# Patient Record
Sex: Female | Born: 1985 | Race: White | Hispanic: No | Marital: Single | Smoking: Current every day smoker
Health system: Southern US, Community
[De-identification: ages and names within clinical notes are randomized; demographics above are authoritative.]

## PROBLEM LIST (undated history)

## (undated) DIAGNOSIS — F1420 Cocaine dependence, uncomplicated: Secondary | ICD-10-CM

## (undated) DIAGNOSIS — M5430 Sciatica, unspecified side: Secondary | ICD-10-CM

## (undated) DIAGNOSIS — F329 Major depressive disorder, single episode, unspecified: Secondary | ICD-10-CM

## (undated) DIAGNOSIS — F319 Bipolar disorder, unspecified: Secondary | ICD-10-CM

## (undated) DIAGNOSIS — F32A Depression, unspecified: Secondary | ICD-10-CM

## (undated) DIAGNOSIS — F609 Personality disorder, unspecified: Secondary | ICD-10-CM

## (undated) DIAGNOSIS — F1120 Opioid dependence, uncomplicated: Secondary | ICD-10-CM

## (undated) DIAGNOSIS — F419 Anxiety disorder, unspecified: Secondary | ICD-10-CM

## (undated) DIAGNOSIS — I38 Endocarditis, valve unspecified: Secondary | ICD-10-CM

## (undated) DIAGNOSIS — R45851 Suicidal ideations: Secondary | ICD-10-CM

## (undated) DIAGNOSIS — M869 Osteomyelitis, unspecified: Secondary | ICD-10-CM

---

## 2014-09-19 ENCOUNTER — Encounter (HOSPITAL_COMMUNITY): Payer: Self-pay

## 2014-09-19 ENCOUNTER — Emergency Department (HOSPITAL_COMMUNITY)
Admission: EM | Admit: 2014-09-19 | Discharge: 2014-09-19 | Disposition: A | Discharge status: Home / Self Care | Payer: Self-pay | Attending: Emergency Medicine | Admitting: Emergency Medicine

## 2014-09-19 DIAGNOSIS — K0889 Other specified disorders of teeth and supporting structures: Secondary | ICD-10-CM

## 2014-09-19 DIAGNOSIS — K088 Other specified disorders of teeth and supporting structures: Secondary | ICD-10-CM | POA: Insufficient documentation

## 2014-09-19 DIAGNOSIS — K029 Dental caries, unspecified: Secondary | ICD-10-CM | POA: Insufficient documentation

## 2014-09-19 DIAGNOSIS — R112 Nausea with vomiting, unspecified: Secondary | ICD-10-CM | POA: Insufficient documentation

## 2014-09-19 DIAGNOSIS — R509 Fever, unspecified: Secondary | ICD-10-CM | POA: Insufficient documentation

## 2014-09-19 HISTORY — DX: Osteomyelitis, unspecified: M86.9

## 2014-09-19 HISTORY — DX: Endocarditis, valve unspecified: I38

## 2014-09-19 HISTORY — DX: Sciatica, unspecified side: M54.30

## 2014-09-19 MED ORDER — ONDANSETRON HCL 4 MG PO TABS: 4.0000 mg | ORAL_TABLET | Freq: Three times a day (TID) | ORAL | Status: DC | PRN

## 2014-09-19 MED ORDER — HYDROCODONE-ACETAMINOPHEN 5-325 MG PO TABS
2.0000 | ORAL_TABLET | Freq: Once | ORAL | Status: AC
  Administered 2014-09-19: 2 via ORAL
  Filled 2014-09-19: qty 2

## 2014-09-19 MED ORDER — CLINDAMYCIN HCL 300 MG PO CAPS
450.0000 mg | ORAL_CAPSULE | Freq: Once | ORAL | Status: AC
  Administered 2014-09-19: 450 mg via ORAL
  Filled 2014-09-19: qty 1

## 2014-09-19 MED ORDER — CLINDAMYCIN HCL 150 MG PO CAPS: 450.0000 mg | ORAL_CAPSULE | Freq: Three times a day (TID) | ORAL | Status: DC

## 2014-09-19 NOTE — ED Notes (Signed)
Social work at bedside, resources provided.  Pt ambulatory with steady gait to exit.

## 2014-09-19 NOTE — ED Provider Notes (Signed)
CSN: [id number]     Arrival date & time 09/19/14  1323 History  This chart was scribed for non-physician practitioner, Danelle Berry, PA-C, working with Blane Ohara, MD by Freida Busman, ED Scribe. This patient was seen in room WTR7/WTR7 and the patient's care was started at 1:37 PM.   Chief Complaint  Patient presents with  . dental abscess    The history is provided by the patient. No language interpreter was used.    HPI Comments:  Brenda Stafford is a 29 y.o. female who presents to the Emergency Department complaining of 5/10 right lower dental pain that stared about 1 week ago. She reports associated swelling to the site that started 2 days ago which has improved today, subjective fever, nausea, and vomiting; no vomiting today. She denies injury to her mouth or recent breaking of the tooth. She has taken ibuproen with minimal relief of her pain.  She denies abdominal pain, headache, difficulty swallowing, difficulty breathing, CP, SOB.  No past medical history on file. No past surgical history on file. No family history on file. Social History  Substance Use Topics  . Smoking status: Not on file  . Smokeless tobacco: Not on file  . Alcohol Use: Not on file   OB History    No data available     Review of Systems  Constitutional: Positive for fever (Subjective).  HENT: Positive for dental problem.   Gastrointestinal: Positive for nausea and vomiting.   Allergies  Review of patient's allergies indicates not on file.  Home Medications   Prior to Admission medications   Not on File   There were no vitals taken for this visit. Physical Exam  Constitutional: She is oriented to person, place, and time. She appears well-developed and well-nourished. No distress.  HENT:  Head: Normocephalic and atraumatic.  Right Ear: External ear normal.  Left Ear: External ear normal.  Nose: Nose normal.  Mouth/Throat: Uvula is midline, oropharynx is clear and moist and mucous membranes are  normal. Mucous membranes are not pale, not dry and not cyanotic. No oral lesions. No trismus in the jaw. Abnormal dentition. Dental caries present. No dental abscesses, uvula swelling or lacerations. No oropharyngeal exudate, posterior oropharyngeal edema, posterior oropharyngeal erythema or tonsillar abscesses.    Right lower jaw, last molar extensive decay TTP to surrounding gums No obvious abscess  Multiple dental caries and missing teeth noted  Submandibular lympadenopthy  No sublingual tenderness    Eyes: Conjunctivae and EOM are normal. Pupils are equal, round, and reactive to light. Right eye exhibits no discharge. Left eye exhibits no discharge. No scleral icterus.  Neck: Normal range of motion. Neck supple. No JVD present. No tracheal deviation present.  Cardiovascular: Normal rate and regular rhythm.   Pulmonary/Chest: Effort normal and breath sounds normal. No stridor. No respiratory distress.  Musculoskeletal: Normal range of motion. She exhibits no edema.  Neurological: She is alert and oriented to person, place, and time. She exhibits normal muscle tone. Coordination normal.  Skin: Skin is warm and dry. No rash noted. She is not diaphoretic. No erythema. No pallor.  Psychiatric: She has a normal mood and affect. Her behavior is normal. Judgment and thought content normal.  Nursing note and vitals reviewed.   ED Course  Procedures  DIAGNOSTIC STUDIES:  Oxygen Saturation is 98% on Ra, normal by my interpretation.    COORDINATION OF CARE:  2:00 PM Discussed treatment plan with pt at bedside and pt agreed to plan.  Labs Review Labs  Reviewed - No data to display  Imaging Review No results found. I have personally reviewed and evaluated these images and lab results as part of my medical decision-making.   EKG Interpretation None      MDM   Final diagnoses:  None   Patient with toothache.  No gross abscess.  Exam unconcerning for Ludwig's angina or spread of  infection.  Will treat with clinda and pain medicine.  Urged patient to follow-up with dentist.  SW consulted to assist pt, new to area, claims she has no where to stay and nothing to eat.  Food and drink given while in the ED.   I personally performed the services described in this documentation, which was scribed in my presence. The recorded information has been reviewed and is accurate.    Danelle Berry, PA-C 09/25/14 1610  Blane Ohara, MD 09/28/14 [phone]

## 2014-09-19 NOTE — ED Notes (Signed)
Per EMS comes in for dental abscess on back right side that has been bothering her x 1 week.  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

## 2014-09-19 NOTE — ED Notes (Signed)
Pt given sandwich and and beverage per request.  Social worker contacted re: providing resources for pt.  Pt reports she "has no place to go and no food to eat".

## 2014-09-19 NOTE — Discharge Instructions (Signed)
Dental Pain °A tooth ache may be caused by cavities (tooth decay). Cavities expose the nerve of the tooth to air and hot or cold temperatures. It may come from an infection or abscess (also called a boil or furuncle) around your tooth. It is also often caused by dental caries (tooth decay). This causes the pain you are having. °DIAGNOSIS  °Your caregiver can diagnose this problem by exam. °TREATMENT  °· If caused by an infection, it may be treated with medications which kill germs (antibiotics) and pain medications as prescribed by your caregiver. Take medications as directed. °· Only take over-the-counter or prescription medicines for pain, discomfort, or fever as directed by your caregiver. °· Whether the tooth ache today is caused by infection or dental disease, you should see your dentist as soon as possible for further care. °SEEK MEDICAL CARE IF: °The exam and treatment you received today has been provided on an emergency basis only. This is not a substitute for complete medical or dental care. If your problem worsens or new problems (symptoms) appear, and you are unable to meet with your dentist, call or return to this location. °SEEK IMMEDIATE MEDICAL CARE IF:  °· You have a fever. °· You develop redness and swelling of your face, jaw, or neck. °· You are unable to open your mouth. °· You have severe pain uncontrolled by pain medicine. °MAKE SURE YOU:  °· Understand these instructions. °· Will watch your condition. °· Will get help right away if you are not doing well or get worse. °Document Released: 01/06/2005 Document Revised: 03/31/2011 Document Reviewed: 08/25/2007 °ExitCare® Patient Information ©2015 [employer]. This information is not intended to replace advice given to you by your health care provider. Make sure you discuss any questions you have with your health care provider. ° °Emergency Department Resource Guide °1) Find a Doctor and Pay Out of Pocket °Although you won't have to find out who  is covered by your insurance plan, it is a good idea to ask around and get recommendations. You will then need to call the office and see if the doctor you have chosen will accept you as a new patient and what types of options they offer for patients who are self-pay. Some doctors offer discounts or will set up payment plans for their patients who do not have insurance, but you will need to ask so you aren't surprised when you get to your appointment. ° °2) Contact Your Local Health Department °Not all health departments have doctors that can see patients for sick visits, but many do, so it is worth a call to see if yours does. If you don't know where your local health department is, you can check in your phone book. The CDC also has a tool to help you locate your state's health department, and many state websites also have listings of all of their local health departments. ° °3) Find a Walk-in Clinic °If your illness is not likely to be very severe or complicated, you may want to try a walk in clinic. These are popping up all over the country in pharmacies, drugstores, and shopping centers. They're usually staffed by nurse practitioners or physician assistants that have been trained to treat common illnesses and complaints. They're usually fairly quick and inexpensive. However, if you have serious medical issues or chronic medical problems, these are probably not your best option. ° °No Primary Care Doctor: °- Call [facility] at  832-8000 - they can help you locate a primary   care doctor that  accepts your insurance, provides certain services, etc. °- Physician Referral Service- 1-800-533-3463 ° °Chronic Pain Problems: °Organization         Address  Phone   Notes  °[facility]  (336) 297-2271 Patients need to be referred by their primary care doctor.  ° °Medication Assistance: °Organization         Address  Phone   Notes  °[place] [facility] [address]  [address] °[place], [place] [address] (336) 641-8030 --Must be a resident of [place] °-- Must have NO insurance coverage whatsoever (no Medicaid/ Medicare, etc.) °-- The pt. MUST have a primary care doctor that directs their care regularly and follows them in the community °  °[facility]  (866) 331-1348   °[employer]  (888) 892-1162   ° °Agencies that provide inexpensive medical care: °Organization         Address  Phone   Notes  °[facility]  (336) 832-8035   °[facility]    (336) 832-7272   °[facility] [address] °[place], [place] [address] (336) 832-4777   °[facility] [address], °[place] (336) 271-4999   °[facility]    (336) 373-0678   °[facility]    (336) 272-1050   °[facility] ° [address], [place] Phone:  (336) 832-4444, Fax:  (336) 832-4440 Hours of Operation:  9 am - 6 pm, M-F.  Also accepts Medicaid/Medicare and self-pay.  °[facility] ° [address], [place] Phone: (336) 832-3150, Fax: (336) 832-3151. Hours of Operation:  8:30 am - 5:30 pm, M-F.  Also accepts Medicaid and self-pay.  °[facility] [place] [address], [place] Phone: (336) 878-6027   °[facility] [address], [place], [place] (336)723-1848, Ext. 123 Mondays & Thursdays: 7-9 AM.  First 15 patients are seen on a first come, first serve basis. °  ° °Medicaid-accepting [place] Providers: ° °Organization         Address  Phone   Notes  °[facility] [address], [place] (336) 641-2100 Also accepts self-pay patients.  °[facility] [address], [place] ° (336) 856-9996   °[facility] [address], [place] (336) 288-8857   °[facility] [address] [place] [address], [place] (336) 299-7000   °Veita Bland [address], [place]  ° (336) 373-1557 Only accepts [place] Access Medicaid patients after they have their name applied to their card.  ° °Self-Pay (no insurance) in [place]: ° °Organization         Address  Phone   Notes  °Sickle Cell Patients, [facility] [address], [place] (336) 832-1970   °[facility] [address], [place] (336) 832-4400   ° [facility] ° [address], [place] (336) 992-4800   °[facility]/Dr. Osei-Bonsu ° [address] [place] [address], [place] or [address], [place] (336) 841-8500 Phone number for both [place] and [place] locations is the same.  °[facility] [address], [place] (336) 299-0000   °[facility] [address] [place] [address], [place] or [address] (336) 852-7530 °(336) 878-2260   °  [facility] [address], [place] (336) 350-1642, phone; (336) 294-5005, fax Sees patients 1st and 3rd Saturday of every month.  Must not qualify for public or private insurance (i.e. Medicaid, Medicare, [place] Health Choice, Veterans' Benefits) • Household income should be no more than 200% of the poverty level •The clinic cannot treat you if you are pregnant or think you are pregnant • Sexually transmitted diseases are not treated at the clinic.  ° ° °Dental Care: °Organization         Address  Phone  Notes  °[facility] [address], [place] (336) 641-6152 Accepts children up to age 21 who are enrolled in Medicaid or [place] Health Choice; pregnant women with a Medicaid card; and children who have applied for Medicaid or [place] Health Choice, but were declined, whose parents can pay a reduced fee at time of service.  °[facility] [place]  [address], [place] (336) 641-7733 Accepts children up to age 21 who are  enrolled in Medicaid or [place] Health Choice; pregnant women with a Medicaid card; and children who have applied for Medicaid or [place] Health Choice, but were declined, whose parents can pay a reduced fee at time of service.  °[facility] ° [address], [place] (336) 641-4533 Patients are seen by appointment only. Walk-ins are not accepted. [facility] will see patients 18 years of age and older. °Monday - Tuesday (8am-5pm) °Most Wednesdays (8:30-5pm) °$30 per visit, cash only  °[facility] ° [address], [place] (336) 641-4533 Patients are seen by appointment only. Walk-ins are not accepted. [facility] will see patients 18 years of age and older. °One Wednesday Evening (Monthly: Volunteer Based).  $30 per visit, cash only  °[facility]  (919) 537-3737 for adults; Children under age 4, call Graduate Pediatric Dentistry at (919) [phone]. Children aged 4-14, please call (919) 537-3737 to request a pediatric application. ° Dental services are provided in all areas of dental care including fillings, crowns and bridges, complete and partial dentures, implants, gum treatment, root canals, and extractions. Preventive care is also provided. Treatment is provided to both adults and children. °Patients are selected via a lottery and there is often a waiting list. °  °[facility] [address], °[place] ° (336) 763-8833 [email or web address] °  °[facility] [address], [place], [place] (336)723-1848, Ext. 123 Second and Fourth Thursday of each month, opens at 6:30 AM; Clinic ends at 9 AM.  Patients are seen on a first-come first-served basis, and a limited number are seen during each clinic.  ° °[facility] ° [address], [place], [place] (336) 723-7904   Eligibility Requirements °You must have lived in [place], [place], or [place] counties for at least the last three months. °  You  cannot be eligible for state or federal sponsored healthcare insurance, including Veterans Administration, Medicaid, or Medicare. °  You generally cannot be eligible for healthcare insurance through your employer.  °  How to apply: °Eligibility screenings are held every Tuesday and Wednesday afternoon from 1:00 pm until 4:00 pm. You do not need an appointment for the interview!  °[facility] [address], [place], [place] 336-631-2330   °[facility]  336-342-8273   °[facility]  336-703-3100   °[facility]  336-570-6415   ° °Behavioral Health Resources in the Community: °Intensive Outpatient Programs °Organization         Address  Phone  Notes  °[facility] [address], [place], [place] 336-878-6098   °[facility] [address], [place], [place] 336-832-9800   °[facility] [address], [place], [place] ° 336-882-2125   °[facility] [address],  °[place], [place] 1-800-853-5163 or 336-641-4981   °Substance Abuse Resources °Organization         Address  Phone  Notes  °[facility]  336-882-2125   °[facility]  336-784-9470   °[facility]  336-285-9073   °[facility]  336-845-3988   °Residential & Outpatient Substance Abuse Program  1-800-659-3381   °Psychological Services °Organization         Address  Phone  Notes  ° Health  336- 832-9600   °[facility]  336- 378-7881   °[facility] [address], [place] 1-800-853-5163 or 336-641-4981   ° °Mobile Crisis Teams °Organization         Address  Phone  Notes  °[facility], Mobile Crisis Care Unit  1-877-626-1772   °Assertive °[facility] ° [address] [place], [place] 336-834-9664   °Sharon DeEsch [address] °[place] 336-554-5454   ° °Self-Help/Support  Groups °Organization         Address  Phone             Notes  °[facility] - variety of support groups  336- 373-1402 Call for more information  °Narcotics Anonymous (NA), [facility] [address], °[place]  2 meetings at this location  ° °Residential Treatment Programs °Organization         Address  Phone  Notes  °[facility] [address],    °[place]  1-866-801-8205   °[facility] ° [address], [place], [place] 704-293-8524   °[facility] [address], [place] 336-845-3988 Admissions: 8am-3pm M-F  °[facility] [address]    °[place], [place] 336-841-1104   °[facility] [address], [place], [place] 336-379-7146   °[facility] [address]  °[place], [place] 336-285-9073   °[facility] - Intensive Outpatient [address], , [place] 336-852-3033   °[facility] [address]  °[place], [place] 1-877-615-2722 or 336-784-9470   °[facility] [address] , [place] 336-227-7417 Accepts Medicaid  °[facility] [address]  ° [place] 1-800-659-3381 Substance Abuse/Addiction Treatment  ° °[place] Behavioral Health Resources °Organization         Address  Phone  Notes  °[facility]  (888) 581-9988   °Julie Brannon, PhD [address] [place], [place]   (336) 349-5553 or (336) 951-0000   °[facility]   [address] °[place],  (336) 349-4454   °[facility] [address], [place],  (336) 342-8316 Insurance/Medicaid/sponsorship through [facility]  °[facility] [address]                                    ,  (336) 342-8316 Therapy/tele-psych/case  °[facility]   [address]  ° [place], [place] (336) 349-2233    °Dr. Arfeen  (336) 349-4544   °[facility] [place]  [employer] [place] [facility] 1) [address],  °2) [address], [place] °3)  [address], [place] (336) 349-3220 °(336) 342-7768 ° °(336) 342-8140   °[place] [facility] (336) 342-1394 or (336) 342-3537 (After Hours)    ° ° ° °

## 2014-09-19 NOTE — ED Notes (Signed)
Bed: WTR7 Expected date:  Expected time:  Means of arrival:  Comments: EMS- 28yo F, dental pain

## 2014-09-23 ENCOUNTER — Encounter (HOSPITAL_COMMUNITY): Payer: Self-pay | Admitting: Emergency Medicine

## 2014-09-23 ENCOUNTER — Emergency Department (HOSPITAL_COMMUNITY)
Admission: EM | Admit: 2014-09-23 | Discharge: 2014-09-24 | Disposition: A | Discharge status: Home / Self Care | Payer: Medicaid - Out of State | Attending: Emergency Medicine | Admitting: Emergency Medicine

## 2014-09-23 DIAGNOSIS — F1410 Cocaine abuse, uncomplicated: Secondary | ICD-10-CM | POA: Insufficient documentation

## 2014-09-23 DIAGNOSIS — Z792 Long term (current) use of antibiotics: Secondary | ICD-10-CM | POA: Insufficient documentation

## 2014-09-23 DIAGNOSIS — F1994 Other psychoactive substance use, unspecified with psychoactive substance-induced mood disorder: Secondary | ICD-10-CM | POA: Diagnosis present

## 2014-09-23 DIAGNOSIS — Z720 Tobacco use: Secondary | ICD-10-CM | POA: Insufficient documentation

## 2014-09-23 DIAGNOSIS — R4689 Other symptoms and signs involving appearance and behavior: Secondary | ICD-10-CM

## 2014-09-23 DIAGNOSIS — F1110 Opioid abuse, uncomplicated: Secondary | ICD-10-CM

## 2014-09-23 DIAGNOSIS — Z880 Allergy status to penicillin: Secondary | ICD-10-CM | POA: Insufficient documentation

## 2014-09-23 DIAGNOSIS — Z8739 Personal history of other diseases of the musculoskeletal system and connective tissue: Secondary | ICD-10-CM | POA: Insufficient documentation

## 2014-09-23 DIAGNOSIS — Z8679 Personal history of other diseases of the circulatory system: Secondary | ICD-10-CM | POA: Insufficient documentation

## 2014-09-23 DIAGNOSIS — R4589 Other symptoms and signs involving emotional state: Secondary | ICD-10-CM

## 2014-09-23 LAB — COMPREHENSIVE METABOLIC PANEL
ALBUMIN: 4.4 g/dL (ref 3.5–5.0)
ALK PHOS: 61 U/L (ref 38–126)
ALT: 37 U/L (ref 14–54)
AST: 38 U/L (ref 15–41)
Anion gap: 7 (ref 5–15)
BILIRUBIN TOTAL: 0.3 mg/dL (ref 0.3–1.2)
BUN: 13 mg/dL (ref 6–20)
CO2: 23 mmol/L (ref 22–32)
Calcium: 9.7 mg/dL (ref 8.9–10.3)
Chloride: 109 mmol/L (ref 101–111)
Creatinine, Ser: 0.92 mg/dL (ref 0.44–1.00)
GFR calc Af Amer: >60 mL/min (ref >60)
GFR calc non Af Amer: >60 mL/min (ref >60)
GLUCOSE: 140 mg/dL — AB (ref 65–99)
POTASSIUM: 3.6 mmol/L (ref 3.5–5.1)
Sodium: 139 mmol/L (ref 135–145)
TOTAL PROTEIN: 8 g/dL (ref 6.5–8.1)

## 2014-09-23 LAB — CBC
HEMATOCRIT: 44 % (ref 36.0–46.0)
HEMOGLOBIN: 15.3 g/dL — AB (ref 12.0–15.0)
MCH: 32.3 pg (ref 26.0–34.0)
MCHC: 34.8 g/dL (ref 30.0–36.0)
MCV: 93 fL (ref 78.0–100.0)
Platelets: 295 10*3/uL (ref 150–400)
RBC: 4.73 MIL/uL (ref 3.87–5.11)
RDW: 14.4 % (ref 11.5–15.5)
WBC: 8 10*3/uL (ref 4.0–10.5)

## 2014-09-23 LAB — RAPID URINE DRUG SCREEN, HOSP PERFORMED
AMPHETAMINES: NOT DETECTED
BARBITURATES: NOT DETECTED
BENZODIAZEPINES: NOT DETECTED
Cocaine: POSITIVE — AB
Opiates: POSITIVE — AB
TETRAHYDROCANNABINOL: NOT DETECTED

## 2014-09-23 LAB — SALICYLATE LEVEL: Salicylate Lvl: 4.6 mg/dL (ref 2.8–30.0)

## 2014-09-23 LAB — I-STAT BETA HCG BLOOD, ED (MC, WL, AP ONLY)

## 2014-09-23 LAB — ACETAMINOPHEN LEVEL: Acetaminophen (Tylenol), Serum: <10 ug/mL — ABNORMAL LOW (ref 10–30)

## 2014-09-23 LAB — ETHANOL: Alcohol, Ethyl (B): <5 mg/dL (ref <5)

## 2014-09-23 MED ORDER — ALUM & MAG HYDROXIDE-SIMETH 200-200-20 MG/5ML PO SUSP: 30.0000 mL | ORAL | Status: DC | PRN

## 2014-09-23 MED ORDER — LORAZEPAM 1 MG PO TABS
1.0000 mg | ORAL_TABLET | Freq: Three times a day (TID) | ORAL | Status: DC | PRN
  Administered 2014-09-23: 1 mg via ORAL
  Filled 2014-09-23: qty 1

## 2014-09-23 MED ORDER — ACETAMINOPHEN 325 MG PO TABS: 650.0000 mg | ORAL_TABLET | ORAL | Status: DC | PRN

## 2014-09-23 MED ORDER — IBUPROFEN 200 MG PO TABS: 600.0000 mg | ORAL_TABLET | Freq: Three times a day (TID) | ORAL | Status: DC | PRN

## 2014-09-23 MED ORDER — CLONIDINE HCL 0.1 MG PO TABS
0.1000 mg | ORAL_TABLET | Freq: Three times a day (TID) | ORAL | Status: DC | PRN
  Administered 2014-09-23: 0.1 mg via ORAL
  Filled 2014-09-23: qty 1

## 2014-09-23 MED ORDER — ZOLPIDEM TARTRATE 5 MG PO TABS
5.0000 mg | ORAL_TABLET | Freq: Every evening | ORAL | Status: DC | PRN
Start: 2014-09-23 — End: 2014-09-24
  Administered 2014-09-23: 5 mg via ORAL
  Filled 2014-09-23: qty 1

## 2014-09-23 MED ORDER — ONDANSETRON HCL 4 MG PO TABS
4.0000 mg | ORAL_TABLET | Freq: Three times a day (TID) | ORAL | Status: DC | PRN
  Administered 2014-09-23: 4 mg via ORAL
  Filled 2014-09-23: qty 1

## 2014-09-23 MED ORDER — NICOTINE 21 MG/24HR TD PT24
21.0000 mg | MEDICATED_PATCH | Freq: Every day | TRANSDERMAL | Status: DC
  Administered 2014-09-23: 21 mg via TRANSDERMAL
  Filled 2014-09-23: qty 1

## 2014-09-23 MED ORDER — LOPERAMIDE HCL 2 MG PO CAPS
2.0000 mg | ORAL_CAPSULE | ORAL | Status: DC | PRN
  Administered 2014-09-23: 2 mg via ORAL
  Filled 2014-09-23: qty 1

## 2014-09-23 NOTE — ED Notes (Signed)
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

## 2014-09-23 NOTE — ED Notes (Signed)
Sandwich,sprite, and cheese provided per request, Sitter at bedside.

## 2014-09-23 NOTE — ED Notes (Addendum)
Noticed call bell had been pulled from the wall at the nurses station. As I was walking in to check on the patient I noticed she was attempting to close the blinds to the room. Upon opening the door, pt had call bell cord wrapped twice around her neck with red indention's into skin around neck where cord had been pulled more tightly. I walked over to patient who was scrambling to try to put the cord back into the wall and apologizing for having pulled it out of the wall. I told her "I need you to take the cord off from around your neck." She said "No, I don't want to do that." I began to attempt to remove the cord from around her neck and called security in to help. The patient allowed me to take the cord off as she began to vigorously eat her sandwich that was on the table. As she was eating I also noticed superficial lacerations that appeared to be new length-wise down her wrists bilaterally. The charge nurse was notified and all belongings/sharp objects/and cords were removed from the patient's room.

## 2014-09-23 NOTE — ED Notes (Signed)
Pt becoming agitated with staff after she was asked to stay in her room. Security called to bedside.
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
## 2014-09-23 NOTE — ED Notes (Signed)
TTS will consult at [facility]- in the hospital now

## 2014-09-23 NOTE — ED Notes (Addendum)
Pt. To [facility] from ED ambulatory without difficulty, to room 34 . Report from Autumn RN. Pt. Is alert and oriented, warm and dry in no distress. Pt. Denies SI, HI, and AVH. Pt. Calm and cooperative. Pt. Made aware of security cameras and Q15 minute rounds. Pt. Encouraged to let Nursing staff know of any concerns or needs.

## 2014-09-23 NOTE — ED Provider Notes (Signed)
CSN: [id number]     Arrival date & time 09/23/14  1750 History   First MD Initiated Contact with Patient 09/23/14 1759     Chief Complaint  Patient presents with  . heroin detox      (Consider location/radiation/quality/duration/timing/severity/associated sxs/prior Treatment) Patient is a 29 y.o. female presenting with drug problem. The history is provided by the patient.  Drug Problem This is a new problem. The current episode started more than 1 week ago. The problem occurs constantly. The problem has not changed since onset.Pertinent negatives include no abdominal pain and no shortness of breath. Nothing aggravates the symptoms. Nothing relieves the symptoms. She has tried nothing for the symptoms.    Past Medical History  Diagnosis Date  . Endocarditis   . Osteomyelitis   . Sciatica    No past surgical history on file. No family history on file. Social History  Substance Use Topics  . Smoking status: Current Every Day Smoker  . Smokeless tobacco: None  . Alcohol Use: No   OB History    No data available     Review of Systems  Constitutional: Negative for fever.  Respiratory: Negative for cough and shortness of breath.   Gastrointestinal: Negative for vomiting and abdominal pain.  Psychiatric/Behavioral: Positive for suicidal ideas.  All other systems reviewed and are negative.     Allergies  Amoxicillin; Bactrim; Keflex; Penicillins; and Tramadol  Home Medications   Prior to Admission medications   Medication Sig Start Date End Date Taking? Authorizing Provider  clindamycin (CLEOCIN) 150 MG capsule Take 3 capsules (450 mg total) by mouth 3 (three) times daily. May dispense as 150mg  capsules 09/19/14   Danelle Berry, PA-C  ondansetron (ZOFRAN) 4 MG tablet Take 1 tablet (4 mg total) by mouth every 8 (eight) hours as needed for nausea or vomiting. 09/19/14   Danelle Berry, PA-C   BP 118/75 mmHg  Pulse 105  Temp(Src) 98.5 F (36.9 C) (Oral)  Resp 16  SpO2 99%  LMP  09/21/2014 Physical Exam  Constitutional: She is oriented to person, place, and time. She appears well-developed and well-nourished. No distress.  HENT:  Head: Normocephalic and atraumatic.  Mouth/Throat: Oropharynx is clear and moist.  Eyes: EOM are normal. Pupils are equal, round, and reactive to light.  Neck: Normal range of motion. Neck supple.  Cardiovascular: Normal rate and regular rhythm.  Exam reveals no friction rub.   No murmur heard. Pulmonary/Chest: Effort normal and breath sounds normal. No respiratory distress. She has no wheezes. She has no rales.  Abdominal: Soft. She exhibits no distension. There is no tenderness. There is no rebound.  Musculoskeletal: Normal range of motion. She exhibits no edema.  Neurological: She is alert and oriented to person, place, and time.  Skin: She is not diaphoretic.  Psychiatric: She expresses suicidal ideation. She expresses no homicidal ideation. She expresses no suicidal plans and no homicidal plans.  Nursing note and vitals reviewed.   ED Course  Procedures (including critical care time) Labs Review Labs Reviewed  COMPREHENSIVE METABOLIC PANEL - Abnormal; Notable for the following:    Glucose, Bld 140 (*)    All other components within normal limits  ACETAMINOPHEN LEVEL - Abnormal; Notable for the following:    Acetaminophen (Tylenol), Serum <10 (*)    All other components within normal limits  CBC - Abnormal; Notable for the following:    Hemoglobin 15.3 (*)    All other components within normal limits  ETHANOL  SALICYLATE LEVEL  URINE  RAPID DRUG SCREEN, HOSP PERFORMED  I-STAT BETA HCG BLOOD, ED ([facility], [facility], [facility] ONLY)    Imaging Review No results found. I have personally reviewed and evaluated these images and lab results as part of my medical decision-making.   EKG Interpretation None      MDM   Final diagnoses:  Suicidal behavior  Heroin abuse    14F here with heroin abuse. Vaguely suicidal without plan. Hx of  suicide attempt previously. Has been using heroin for years, has only done formal detox once. AFVSS here. Tearful, just broke up with her boyfriend. Will consult TTS. Patient tried to strangle herself with a cord off the wall. IVC placed by me.  Elwin Mocha, MD 09/23/14 2255

## 2014-09-23 NOTE — [facility] Counselor (Signed)
Consulted with Nanine Means, DNP who recommends patient be observed overnight and evaluated in the morning by psychiatry.  Informed EDP Dr. Gwendolyn Grant of disposition and he states that he will order labs and send patient back to [facility].   Davina Poke, LCSW Therapeutic Triage Specialist [facility] 09/23/2014 7:44 PM

## 2014-09-24 ENCOUNTER — Encounter (HOSPITAL_COMMUNITY): Payer: Self-pay

## 2014-09-24 ENCOUNTER — Emergency Department (HOSPITAL_COMMUNITY)
Admission: EM | Admit: 2014-09-24 | Discharge: 2014-09-25 | Disposition: A | Discharge status: Other Acute Inpatient Hospital | Payer: Medicaid - Out of State | Attending: Emergency Medicine | Admitting: Emergency Medicine

## 2014-09-24 DIAGNOSIS — F329 Major depressive disorder, single episode, unspecified: Secondary | ICD-10-CM | POA: Insufficient documentation

## 2014-09-24 DIAGNOSIS — Z79899 Other long term (current) drug therapy: Secondary | ICD-10-CM | POA: Insufficient documentation

## 2014-09-24 DIAGNOSIS — F1994 Other psychoactive substance use, unspecified with psychoactive substance-induced mood disorder: Secondary | ICD-10-CM | POA: Diagnosis present

## 2014-09-24 DIAGNOSIS — F1420 Cocaine dependence, uncomplicated: Secondary | ICD-10-CM | POA: Insufficient documentation

## 2014-09-24 DIAGNOSIS — Z720 Tobacco use: Secondary | ICD-10-CM | POA: Insufficient documentation

## 2014-09-24 DIAGNOSIS — F1110 Opioid abuse, uncomplicated: Secondary | ICD-10-CM | POA: Insufficient documentation

## 2014-09-24 DIAGNOSIS — R45851 Suicidal ideations: Secondary | ICD-10-CM

## 2014-09-24 DIAGNOSIS — Z880 Allergy status to penicillin: Secondary | ICD-10-CM | POA: Insufficient documentation

## 2014-09-24 DIAGNOSIS — Z8739 Personal history of other diseases of the musculoskeletal system and connective tissue: Secondary | ICD-10-CM | POA: Insufficient documentation

## 2014-09-24 DIAGNOSIS — Z8679 Personal history of other diseases of the circulatory system: Secondary | ICD-10-CM | POA: Insufficient documentation

## 2014-09-24 MED ORDER — CITALOPRAM HYDROBROMIDE 40 MG PO TABS
40.0000 mg | ORAL_TABLET | Freq: Every day | ORAL | Status: DC
  Administered 2014-09-24: 40 mg via ORAL
  Filled 2014-09-24: qty 1

## 2014-09-24 MED ORDER — OXYMETAZOLINE HCL 0.05 % NA SOLN
1.0000 | Freq: Two times a day (BID) | NASAL | Status: DC | PRN
  Filled 2014-09-24: qty 15

## 2014-09-24 MED ORDER — HYDROXYZINE HCL 50 MG/ML IM SOLN: 50.0000 mg | Freq: Four times a day (QID) | INTRAMUSCULAR | Status: DC | PRN

## 2014-09-24 MED ORDER — GABAPENTIN 300 MG PO CAPS
900.0000 mg | ORAL_CAPSULE | Freq: Three times a day (TID) | ORAL | Status: DC
  Administered 2014-09-24: 900 mg via ORAL
  Filled 2014-09-24: qty 3

## 2014-09-24 MED ORDER — NICOTINE 21 MG/24HR TD PT24
21.0000 mg | MEDICATED_PATCH | Freq: Every day | TRANSDERMAL | Status: DC
  Administered 2014-09-24: 21 mg via TRANSDERMAL
  Filled 2014-09-24: qty 1

## 2014-09-24 MED ORDER — NICOTINE 21 MG/24HR TD PT24: 21.0000 mg | MEDICATED_PATCH | Freq: Every day | TRANSDERMAL | Status: DC

## 2014-09-24 MED ORDER — CLINDAMYCIN HCL 300 MG PO CAPS
450.0000 mg | ORAL_CAPSULE | Freq: Three times a day (TID) | ORAL | Status: DC
  Administered 2014-09-24: 450 mg via ORAL
  Filled 2014-09-24: qty 1

## 2014-09-24 MED ORDER — HYDROXYZINE HCL 25 MG PO TABS
25.0000 mg | ORAL_TABLET | Freq: Four times a day (QID) | ORAL | Status: DC | PRN
  Administered 2014-09-24: 25 mg via ORAL
  Filled 2014-09-24: qty 1

## 2014-09-24 MED ORDER — HYDROXYZINE HCL 25 MG PO TABS
50.0000 mg | ORAL_TABLET | Freq: Three times a day (TID) | ORAL | Status: DC | PRN
  Administered 2014-09-24: 50 mg via ORAL
  Filled 2014-09-24: qty 2

## 2014-09-24 NOTE — ED Notes (Signed)
Pt. Noted in room. No complaints or concerns voiced. No distress or abnormal behavior noted. Will continue to monitor with security cameras. Q 15 minute rounds continue. 

## 2014-09-24 NOTE — ED Notes (Signed)
Eating lunch 

## 2014-09-24 NOTE — [facility] Counselor (Signed)
Counselor spoke with patient about her discharge plan. Patient states that she was able to contact [facility] and states that she plans to go on Tuesday as they said they will have open beds. Counselor reviewed information for shelters with patient and she states that she will contact if needed. Patient asked what time lunch was and was informed that the nurse was notified of her request to wait for discharge until after lunch. Patient states that she does not have any questions or concerns at this time.   Davina Poke, LCSW Therapeutic Triage Specialist [facility] 09/24/2014 12:45 PM

## 2014-09-24 NOTE — ED Notes (Signed)
tts into see, IVC has been recinded

## 2014-09-24 NOTE — ED Notes (Signed)
TTS [place] at bedside to eval pt. 
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
## 2014-09-24 NOTE — ED Notes (Signed)
Pt. C/o nasal congestion and insomnia.

## 2014-09-24 NOTE — ED Notes (Addendum)
Written dc instructions and rx reviewed w/ pt.  Support given, pt encouraged not to use drugs  and to contact referals for OP and in patient treatment centers given.   Pt requesting number for treatment center in [place], number given.  Bus pass given.  Pt ambulatory w/o difficulty to dc window w/ mHt. Belongings returned after leaving the  Unit.

## 2014-09-24 NOTE — ED Notes (Signed)
Pt. Noted sleeping in room. No complaints or concerns voiced. No distress or abnormal behavior noted. Will continue to monitor with security cameras. Q 15 minute rounds continue. 

## 2014-09-24 NOTE — ED Notes (Signed)
Multiple snacks and beverages given including sandwich, broths, ginger ale and crackers.

## 2014-09-24 NOTE — ED Notes (Signed)
Pt. Noted resting in room. No complaints or concerns voiced. No distress or abnormal behavior noted. Will continue to monitor with security cameras. Q 15 minute rounds continue. 

## 2014-09-24 NOTE — ED Provider Notes (Signed)
CSN: [id number]     Arrival date & time 09/24/14  1749 History   First MD Initiated Contact with Patient 09/24/14 1943     Chief Complaint  Patient presents with  . Suicidal     (Consider location/radiation/quality/duration/timing/severity/associated sxs/prior Treatment) Patient is a 29 y.o. female presenting with mental health disorder. The history is provided by the patient.  Mental Health Problem Presenting symptoms: suicidal thoughts   Degree of incapacity (severity):  Moderate Onset quality:  Gradual Timing:  Constant Progression:  Unchanged Chronicity:  Recurrent Context: drug abuse   Treatment compliance:  All of the time Relieved by:  Nothing Worsened by:  Nothing tried Ineffective treatments:  None tried Associated symptoms: anhedonia   Risk factors: hx of mental illness and hx of suicide attempts     Past Medical History  Diagnosis Date  . Endocarditis   . Osteomyelitis   . Sciatica    No past surgical history on file. No family history on file. Social History  Substance Use Topics  . Smoking status: Current Every Day Smoker  . Smokeless tobacco: None  . Alcohol Use: No   OB History    No data available     Review of Systems  Psychiatric/Behavioral: Positive for suicidal ideas.  All other systems reviewed and are negative.     Allergies  Amoxicillin; Bactrim; Keflex; Penicillins; and Tramadol  Home Medications   Prior to Admission medications   Medication Sig Start Date End Date Taking? Authorizing Provider  nicotine (NICODERM CQ - DOSED IN MG/24 HOURS) 21 mg/24hr patch Place 1 patch (21 mg total) onto the skin daily. 09/24/14  Yes Earney Navy, NP  citalopram (CELEXA) 40 MG tablet Take 1 tablet by mouth daily. 09/04/14   Historical Provider, MD  clindamycin (CLEOCIN) 150 MG capsule Take 3 capsules (450 mg total) by mouth 3 (three) times daily. May dispense as  capsules Patient not taking: Reported on 09/23/2014 09/19/14   Danelle Berry, PA-C   gabapentin (NEURONTIN) 300 MG capsule Take 3 capsules by mouth 3 (three) times daily. 09/04/14   Historical Provider, MD   BP 135/77 mmHg  Pulse 67  Temp(Src) 98.4 F (36.9 C) (Oral)  Resp 17  SpO2 98%  LMP 09/21/2014 Physical Exam  Constitutional: She is oriented to person, place, and time. She appears well-developed and well-nourished. No distress.  HENT:  Head: Normocephalic.  Eyes: Conjunctivae are normal.  Neck: Neck supple. No tracheal deviation present.  Cardiovascular: Normal rate and regular rhythm.   Pulmonary/Chest: Effort normal. No respiratory distress.  Abdominal: Soft. She exhibits no distension.  Neurological: She is alert and oriented to person, place, and time.  Skin: Skin is warm and dry.  Psychiatric: She exhibits a depressed mood.    ED Course  Procedures (including critical care time) Labs Review Labs Reviewed - No data to display  Imaging Review No results found. I have personally reviewed and evaluated these images and lab results as part of my medical decision-making.   EKG Interpretation None      MDM   Final diagnoses:  Suicidal ideation    29 year old feel presents with recurrent suicidal thoughts after being discharged earlier today. She has a history of prior overdose and attempting to cut her own throat. She endorses thoughts of the repeated attempts now. TTS consulted for suicidal patient violation.  MEDICALLY CLEAR FOR TRANSFER OR PSYCHIATRIC ADMISSION. Patient was accepted to behavioral health at [facility].  Lyndal Pulley, MD 09/24/14 [phone]

## 2014-09-24 NOTE — ED Notes (Signed)
Bed: [facility] Expected date:  Expected time:  Means of arrival:  Comments: Hold for Triage 3

## 2014-09-24 NOTE — Consult Note (Signed)
[facility] Consult   Reason for Consult:  Substance induced mood disorder, Opioid use disorder, severe, Cocaine use disorder,Moderate use  Referring Physician:  EDP Patient Identification: Brenda Stafford MRN:  [id number] Principal Diagnosis: Substance induced mood disorder Diagnosis:   Patient Active Problem List   Diagnosis Date Noted  . Substance induced mood disorder [F19.94] 09/24/2014    Priority: High    Total Time spent with patient: 1 hour  Subjective:   Brenda Stafford is a 29 y.o. female patient admitted with Substance induced mood disorder, Opioid use disorder, severe, Cocaine use disorder,Moderate use  HPI:  Caucasian female, 29 years old was evaluated seeking detox from Opiates and Cocaine.  Patient reported using unknown amount of Heroin daily.   She reports that she was off Heroin for a year after staying clean for 1 month. She also started using Cocaine, unknown amount three weeks ago after stopping use for 2 years.  Patient reports previous detox treatment at [facility] in [place] that lasted for 3 days only.   Patient reports that she is homeless and unemployed at this time.  Patient reports that her parents are supportive and willing to take her in after detox treatment.  Patient reports that she superficially cut her wrist to relieve tension and not to kill herself.  Patient denied SI/HI/AVH today and stated that she needed detox treatment so she can move in with her parents.   She also stated that she has three children to live for.   Patient also stated that she had no insurance when she was treated at [facility] but now plans to go there for long term treatment because she has insurance.  Patient has been given information for outpatient rehabilitation facilities around [place] and also encouraged to seek Long term substance abuse rehabilitation at [facility] as planned.  Patient is discharged home.    HPI Elements:   Location:  Substance induced mood disorder, Opioid use  disorder, severe, Cocaine use disorder, Moderate. Quality:  Moderate-severe. Severity:  Moderate-severe. Timing:  Acute. Duration:  Sudden. Context:  Seeking detox and rehabilitation treatment from Opiates and Cocaine.  Past Medical History:  Past Medical History  Diagnosis Date  . Endocarditis   . Osteomyelitis   . Sciatica    No past surgical history on file. Family History: No family history on file. Social History:  History  Alcohol Use No     History  Drug Use  . Yes    Comment: heroin    Social History   Social History  . Marital Status: Single    Spouse Name: N/A  . Number of Children: N/A  . Years of Education: N/A   Social History Main Topics  . Smoking status: Current Every Day Smoker  . Smokeless tobacco: None  . Alcohol Use: No  . Drug Use: Yes     Comment: heroin  . Sexual Activity: Not Asked   Other Topics Concern  . None   Social History Narrative   Additional Social History:    Pain Medications: See PTA Prescriptions: See PTA Over the Counter: See PTA History of alcohol / drug use?: Yes Longest period of sobriety (when/how long): 2 years Name of Substance 1: Heroin 1 - Age of First Use: 26 1 - Amount (size/oz): 3 bags-.2 grams 1 - Frequency: daily 1 - Duration: 3 years 1 - Last Use / Amount: 09/23/2014- "what I had left" Name of Substance 2: Cocaine 2 - Age of First Use: 22 2 - Amount (size/oz): $  20 2 - Frequency: "just started using again" 2 - Duration: ongoing 2 - Last Use / Amount: 09/23/2014   Allergies:   Allergies  Allergen Reactions  . Amoxicillin   . Bactrim [Sulfamethoxazole-Trimethoprim]   . Keflex [Cephalexin]   . Penicillins   . Tramadol     Labs:  Results for orders placed or performed during the hospital encounter of 09/23/14 (from the past 48 hour(s))  Comprehensive metabolic panel     Status: Abnormal   Collection Time: 09/23/14  8:25 PM  Result Value Ref Range   Sodium 139 135 - 145 mmol/L   Potassium 3.6  3.5 - 5.1 mmol/L   Chloride 109 101 - 111 mmol/L   CO2 23 22 - 32 mmol/L   Glucose, Bld 140 (H) 65 - 99 mg/dL   BUN 13 6 - 20 mg/dL   Creatinine, Ser 0.92 0.44 - 1.00 mg/dL   Calcium 9.7 8.9 - 10.3 mg/dL   Total Protein 8.0 6.5 - 8.1 g/dL   Albumin 4.4 3.5 - 5.0 g/dL   AST 38 15 - 41 U/L   ALT 37 14 - 54 U/L   Alkaline Phosphatase 61 38 - 126 U/L   Total Bilirubin 0.3 0.3 - 1.2 mg/dL   GFR calc non Af Amer >60 >60 mL/min   GFR calc Af Amer >60 >60 mL/min    Comment: (NOTE) The eGFR has been calculated using the CKD EPI equation. This calculation has not been validated in all clinical situations. eGFR's persistently <60 mL/min signify possible Chronic Kidney Disease.    Anion gap 7 5 - 15  Ethanol (ETOH)     Status: None   Collection Time: 09/23/14  8:25 PM  Result Value Ref Range   Alcohol, Ethyl (B) <5 <5 mg/dL    Comment:        LOWEST DETECTABLE LIMIT FOR SERUM ALCOHOL IS 5 mg/dL FOR MEDICAL PURPOSES ONLY   Salicylate level     Status: None   Collection Time: 09/23/14  8:25 PM  Result Value Ref Range   Salicylate Lvl 4.6 2.8 - 30.0 mg/dL  Acetaminophen level     Status: Abnormal   Collection Time: 09/23/14  8:25 PM  Result Value Ref Range   Acetaminophen (Tylenol), Serum <10 (L) 10 - 30 ug/mL    Comment:        THERAPEUTIC CONCENTRATIONS VARY SIGNIFICANTLY. A RANGE OF 10-30 ug/mL MAY BE AN EFFECTIVE CONCENTRATION FOR MANY PATIENTS. HOWEVER, SOME ARE BEST TREATED AT CONCENTRATIONS OUTSIDE THIS RANGE. ACETAMINOPHEN CONCENTRATIONS >150 ug/mL AT 4 HOURS AFTER INGESTION AND >50 ug/mL AT 12 HOURS AFTER INGESTION ARE OFTEN ASSOCIATED WITH TOXIC REACTIONS.   CBC     Status: Abnormal   Collection Time: 09/23/14  8:25 PM  Result Value Ref Range   WBC 8.0 4.0 - 10.5 K/uL   RBC 4.73 3.87 - 5.11 MIL/uL   Hemoglobin 15.3 (H) 12.0 - 15.0 g/dL   HCT 44.0 36.0 - 46.0 %   MCV 93.0 78.0 - 100.0 fL   MCH 32.3 26.0 - 34.0 pg   MCHC 34.8 30.0 - 36.0 g/dL   RDW 14.4 11.5 -  15.5 %   Platelets 295 150 - 400 K/uL  I-Stat beta hCG blood, ED ([facility], [facility], [facility] only)     Status: None   Collection Time: 09/23/14  8:29 PM  Result Value Ref Range   I-stat hCG, quantitative <5.0 <5 mIU/mL   Comment 3  Comment:   GEST. AGE      CONC.  (mIU/mL)   <=1 WEEK        5 - 50     2 WEEKS       50 - 500     3 WEEKS       100 - 10,000     4 WEEKS     1,000 - 30,000        FEMALE AND NON-PREGNANT FEMALE:     LESS THAN 5 mIU/mL   Urine rapid drug screen (hosp performed) (Not at [facility])     Status: Abnormal   Collection Time: 09/23/14 10:45 PM  Result Value Ref Range   Opiates POSITIVE (A) NONE DETECTED   Cocaine POSITIVE (A) NONE DETECTED   Benzodiazepines NONE DETECTED NONE DETECTED   Amphetamines NONE DETECTED NONE DETECTED   Tetrahydrocannabinol NONE DETECTED NONE DETECTED   Barbiturates NONE DETECTED NONE DETECTED    Comment:        DRUG SCREEN FOR MEDICAL PURPOSES ONLY.  IF CONFIRMATION IS NEEDED FOR ANY PURPOSE, NOTIFY LAB WITHIN 5 DAYS.        LOWEST DETECTABLE LIMITS FOR URINE DRUG SCREEN Drug Class       Cutoff (ng/mL) Amphetamine      1000 Barbiturate      200 Benzodiazepine   098 Tricyclics       119 Opiates          300 Cocaine          300 THC              50     Vitals: Blood pressure 101/50, pulse 65, temperature 97.8 F (36.6 C), temperature source Oral, resp. rate 18, last menstrual period 09/21/2014, SpO2 100 %.  Risk to Self: Suicidal Ideation: Yes-Currently Present Suicidal Intent: Yes-Currently Present Is patient at risk for suicide?: No Suicidal Plan?: Yes-Currently Present Specify Current Suicidal Plan: to cut throat Access to Means: No What has been your use of drugs/alcohol within the last 12 months?: cocaine and heroin How many times?: 4 (3x in past year) Other Self Harm Risks: Cutting Burning Triggers for Past Attempts: Other (Comment) ("stress") Intentional Self Injurious Behavior: Cutting, Burning Comment - Self Injurious  Behavior: Cuts on arms, burned self about one month ago Risk to Others: Homicidal Ideation: No Thoughts of Harm to Others: No Current Homicidal Intent: No Current Homicidal Plan: No Access to Homicidal Means: No Identified Victim: Denies History of harm to others?: No Assessment of Violence: None Noted Violent Behavior Description: Denies Does patient have access to weapons?: No Criminal Charges Pending?: No Does patient have a court date: No Prior Inpatient Therapy: Prior Inpatient Therapy: Yes Prior Therapy Dates: 2013 Prior Therapy Facilty/Provider(s): [facility] Reason for Treatment: SA Prior Outpatient Therapy: Prior Outpatient Therapy: Yes Prior Therapy Dates: 2016 Prior Therapy Facilty/Provider(s): [facility] Reason for Treatment: Self Harm Does patient have an ACCT team?: No Does patient have Intensive In-House Services?  : No Does patient have [facility] services? : No Does patient have P4CC services?: No  Current Facility-Administered Medications  Medication Dose Route Frequency Provider Last Rate Last Dose  . acetaminophen (TYLENOL) tablet 650 mg  650 mg Oral Q4H PRN Evelina Bucy, MD      . alum & mag hydroxide-simeth (MAALOX/MYLANTA) 200-200-20 MG/5ML suspension 30 mL  30 mL Oral PRN Evelina Bucy, MD      . cloNIDine (CATAPRES) tablet 0.1 mg  0.1 mg Oral TID PRN Evelina Bucy, MD  0.1 mg at 09/23/14 2306  . hydrOXYzine (ATARAX/VISTARIL) tablet 25 mg  25 mg Oral Q6H PRN Alfonzo Beers, MD   25 mg at 09/24/14 0136  . ibuprofen (ADVIL,MOTRIN) tablet 600 mg  600 mg Oral Q8H PRN Evelina Bucy, MD      . loperamide (IMODIUM) capsule 2 mg  2 mg Oral PRN Evelina Bucy, MD   2 mg at 09/23/14 2306  . LORazepam (ATIVAN) tablet 1 mg  1 mg Oral Q8H PRN Evelina Bucy, MD   1 mg at 09/23/14 2054  . nicotine (NICODERM CQ - dosed in mg/24 hours) patch 21 mg  21 mg Transdermal Daily Evelina Bucy, MD   21 mg at 09/23/14 2224  . ondansetron (ZOFRAN) tablet 4 mg  4 mg Oral Q8H PRN Evelina Bucy, MD   4  mg at 09/23/14 2249  . oxymetazoline (AFRIN) 0.05 % nasal spray 1 spray  1 spray Each Nare BID PRN Alfonzo Beers, MD      . zolpidem (AMBIEN) tablet 5 mg  5 mg Oral QHS PRN Evelina Bucy, MD   5 mg at 09/23/14 2249   Current Outpatient Prescriptions  Medication Sig Dispense Refill  . citalopram (CELEXA) 40 MG tablet Take 1 tablet by mouth daily.  1  . gabapentin (NEURONTIN) 300 MG capsule Take 3 capsules by mouth 3 (three) times daily.  0  . pantoprazole (PROTONIX) 40 MG tablet Take 1 tablet by mouth daily.  3  . clindamycin (CLEOCIN) 150 MG capsule Take 3 capsules (450 mg total) by mouth 3 (three) times daily. May dispense as 147m capsules (Patient not taking: Reported on 09/23/2014) 90 capsule 0  . ondansetron (ZOFRAN) 4 MG tablet Take 1 tablet (4 mg total) by mouth every 8 (eight) hours as needed for nausea or vomiting. (Patient not taking: Reported on 09/23/2014) 20 tablet 0    Musculoskeletal: Strength & Muscle Tone: within normal limits Gait & Station: normal Patient leans: N/A  Psychiatric Specialty Exam: Physical Exam  Review of Systems  Constitutional: Negative.   HENT: Negative.   Eyes: Negative.   Respiratory: Negative.   Cardiovascular: Negative.   Gastrointestinal: Negative.   Genitourinary: Negative.   Musculoskeletal: Negative.   Skin: Negative.   Neurological: Negative.   Endo/Heme/Allergies: Negative.     Blood pressure 101/50, pulse 65, temperature 97.8 F (36.6 C), temperature source Oral, resp. rate 18, last menstrual period 09/21/2014, SpO2 100 %.There is no height or weight on file to calculate BMI.  General Appearance: Casual  Eye Contact::  Fair  Speech:  Clear and Coherent and Normal Rate  Volume:  Normal  Mood:  Angry  Affect:  Congruent  Thought Process:  Coherent, Goal Directed and Intact  Orientation:  Full (Time, Place, and Person)  Thought Content:  WDL  Suicidal Thoughts:  No  Homicidal Thoughts:  No  Memory:  Immediate;   Good Recent;    Good Remote;   Good  Judgement:  Fair  Insight:  Good  Psychomotor Activity:  Normal  Concentration:  Good  Recall:  NA  Fund of Knowledge:Good  Language: Good  Akathisia:  NA  Handed:  Right  AIMS (if indicated):     Assets:  Desire for Improvement  ADL's:  Intact  Cognition: WNL  Sleep:      Medical Decision Making: Established Problem, Stable/Improving (1)  Disposition:  Discharge home with information for outpatient  Rehabilitation facilities.  ODelfin Gant  PMHNP-BC 09/24/2014 11:48 AM  Patient seen face-to-face for  psychiatric evaluation, chart reviewed and case discussed with the physician extender and developed treatment plan. Reviewed the information documented and agree with the treatment plan. Corena Pilgrim, MD

## 2014-09-24 NOTE — ED Notes (Signed)
TTS into see 

## 2014-09-24 NOTE — BHH Counselor (Signed)
Counselor provided information for discharge per psychiatrist. Patient states that if she leaves she will want to kill herself. Informed the psychiatrist who talked to patient due to patient denying SI earlier because patient requested detox. Went back into to speak with the patient who states that she would like to be discharged after lunch time. Discussed resources with patient for detox. Encouraged patient to use the phone to contact the detox facilities to request assessment and admission.  Patient states that she is ok with the plan to contact facilities to request to be assessed for admission. Patient denies questions or concerns at this time. Informed patients nurse of plan. Patient took Pharmacist, hospital and used the phone to contact facilities.  Counselor is available to patient if she has further questions or concerns.    Davina Poke, LCSW Therapeutic Triage Specialist [facility] 09/24/2014 11:37 AM'

## 2014-09-24 NOTE — ED Notes (Signed)
She states she was seen here earlier today and admitted to heroin abuse.  She was d/c'd. With resource sheet.  She returns at this time telling us "I'm going to kill myself if I don't get help".  She is in no distress.

## 2014-09-24 NOTE — ED Notes (Signed)
On the phone 

## 2014-09-24 NOTE — Discharge Instructions (Signed)
Patient has contacted [facility] at [phone] and the patient reports will have a bed Tuesday and she will go there. Patient has been provided a list of local shelters and reports that she will contact the shelters if needed. Patient can also contact [facility] at [phone] for additional assistance if needed.

## 2014-09-24 NOTE — ED Notes (Signed)
Pt AAO x 3, cooperative but anxious.  Pt reports SI with plan to cut her throat.  Admits to same 1 year ago.  Feeling hopeless.  Denies HI or AV hallucinations.  Pt reports diagnosed with Borderline Personality, Bipolar DO, Depression, Anxiety.  Complaint of generalized body pain, rates as 8 on pain scale.  Monitoring for safety, Q 15 min checks in effect.

## 2014-09-24 NOTE — BHH Suicide Risk Assessment (Cosign Needed)
Suicide Risk Assessment  Discharge Assessment   [facility] Discharge Suicide Risk Assessment   Demographic Factors:  Adolescent or young adult, Caucasian, Low socioeconomic status and Unemployed  Total Time spent with patient: 20 minutes  Musculoskeletal: Strength & Muscle Tone: within normal limits Gait & Station: normal Patient leans: N/A  Psychiatric Specialty Exam:     Blood pressure 101/50, pulse 65, temperature 97.8 F (36.6 C), temperature source Oral, resp. rate 18, last menstrual period 09/21/2014, SpO2 100 %.There is no height or weight on file to calculate BMI.  General Appearance: Casual  Eye Contact:: Fair  Speech: Clear and Coherent and Normal Rate  Volume: Normal  Mood: Angry  Affect: Congruent  Thought Process: Coherent, Goal Directed and Intact  Orientation: Full (Time, Place, and Person)  Thought Content: WDL  Suicidal Thoughts: No  Homicidal Thoughts: No  Memory: Immediate; Good Recent; Good Remote; Good  Judgement: Fair  Insight: Good  Psychomotor Activity: Normal  Concentration: Good  Recall: NA  Fund of Knowledge:Good  Language: Good  Akathisia: NA  Handed: Right  AIMS (if indicated):    Assets: Desire for Improvement  ADL's: Intact  Cognition: WNL             Has this patient used any form of tobacco in the last 30 days? (Cigarettes, Smokeless Tobacco, Cigars, and/or Pipes) Yes, Prescription not provided because: Offered Nicotine Patch to patient who accepted today  Mental Status Per Nursing Assessment::   On Admission:     Current Mental Status by Physician: NA  Loss Factors: NA  Historical Factors: NA and Reports that she cuts her wrist to relieve tension  Risk Reduction Factors:   Responsible for children under 50 years of age and Want to get her 3 kids back from her parents and Mother inlaw after treatment.  Continued Clinical Symptoms:  Depression:    Insomnia Alcohol/Substance Abuse/Dependencies  Cognitive Features That Contribute To Risk:  Polarized thinking    Suicide Risk:  Minimal: No identifiable suicidal ideation.  Patients presenting with no risk factors but with morbid ruminations; may be classified as minimal risk based on the severity of the depressive symptoms  Principal Problem: Substance induced mood disorder Discharge Diagnoses:  Patient Active Problem List   Diagnosis Date Noted  . Substance induced mood disorder [F19.94] 09/24/2014    Priority: High  . 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 Cocaine use disorder, moderate, dependence [F14.20]       Plan Of Care/Follow-up recommendations:  Activity:  as tolerated Diet:  Regular  Is patient on multiple antipsychotic therapies at discharge:  No   Has Patient had three or more failed trials of antipsychotic monotherapy by history:  No  Recommended Plan for Multiple Antipsychotic Therapies: NA    Jayant Kriz C   PMHNP-BC 09/24/2014, 12:36 PM

## 2014-09-25 ENCOUNTER — Encounter (HOSPITAL_COMMUNITY): Payer: Self-pay

## 2014-09-25 ENCOUNTER — Inpatient Hospital Stay (HOSPITAL_COMMUNITY)
Admission: AD | Admit: 2014-09-25 | Discharge: 2014-10-02 | DRG: 885 | Disposition: A | Discharge status: Home / Self Care | Payer: Federal, State, Local not specified - Other | Source: Intra-hospital | Attending: Psychiatry | Admitting: Psychiatry

## 2014-09-25 DIAGNOSIS — F17210 Nicotine dependence, cigarettes, uncomplicated: Secondary | ICD-10-CM | POA: Diagnosis present

## 2014-09-25 DIAGNOSIS — F319 Bipolar disorder, unspecified: Secondary | ICD-10-CM | POA: Diagnosis present

## 2014-09-25 DIAGNOSIS — F1420 Cocaine dependence, uncomplicated: Secondary | ICD-10-CM | POA: Diagnosis present

## 2014-09-25 DIAGNOSIS — R45851 Suicidal ideations: Secondary | ICD-10-CM | POA: Diagnosis not present

## 2014-09-25 DIAGNOSIS — F332 Major depressive disorder, recurrent severe without psychotic features: Secondary | ICD-10-CM | POA: Diagnosis present

## 2014-09-25 DIAGNOSIS — F1120 Opioid dependence, uncomplicated: Secondary | ICD-10-CM | POA: Diagnosis not present

## 2014-09-25 DIAGNOSIS — F1220 Cannabis dependence, uncomplicated: Secondary | ICD-10-CM | POA: Diagnosis present

## 2014-09-25 DIAGNOSIS — Z8659 Personal history of other mental and behavioral disorders: Secondary | ICD-10-CM

## 2014-09-25 DIAGNOSIS — F1190 Opioid use, unspecified, uncomplicated: Secondary | ICD-10-CM

## 2014-09-25 MED ORDER — CLONIDINE HCL 0.1 MG PO TABS
0.1000 mg | ORAL_TABLET | ORAL | Status: AC
  Administered 2014-09-27 – 2014-09-28 (×3): 0.1 mg via ORAL
  Filled 2014-09-25 (×4): qty 1

## 2014-09-25 MED ORDER — NICOTINE 21 MG/24HR TD PT24
21.0000 mg | MEDICATED_PATCH | Freq: Every day | TRANSDERMAL | Status: DC
  Administered 2014-09-25 – 2014-10-02 (×8): 21 mg via TRANSDERMAL
  Filled 2014-09-25 (×12): qty 1

## 2014-09-25 MED ORDER — GABAPENTIN 300 MG PO CAPS
900.0000 mg | ORAL_CAPSULE | Freq: Three times a day (TID) | ORAL | Status: DC
  Administered 2014-09-25 – 2014-10-02 (×22): 900 mg via ORAL
  Filled 2014-09-25 (×9): qty 3
  Filled 2014-09-25: qty 63
  Filled 2014-09-25 (×4): qty 3
  Filled 2014-09-25: qty 63
  Filled 2014-09-25 (×8): qty 3
  Filled 2014-09-25: qty 63
  Filled 2014-09-25 (×5): qty 3

## 2014-09-25 MED ORDER — ONDANSETRON 4 MG PO TBDP
4.0000 mg | ORAL_TABLET | Freq: Four times a day (QID) | ORAL | Status: AC | PRN
  Administered 2014-09-26 – 2014-09-27 (×2): 4 mg via ORAL
  Filled 2014-09-25 (×2): qty 1

## 2014-09-25 MED ORDER — ACETAMINOPHEN 325 MG PO TABS
650.0000 mg | ORAL_TABLET | Freq: Four times a day (QID) | ORAL | Status: DC | PRN
  Administered 2014-09-28 – 2014-09-29 (×3): 650 mg via ORAL
  Filled 2014-09-25 (×4): qty 2

## 2014-09-25 MED ORDER — LOPERAMIDE HCL 2 MG PO CAPS: 2.0000 mg | ORAL_CAPSULE | ORAL | Status: AC | PRN

## 2014-09-25 MED ORDER — CLONIDINE HCL 0.1 MG PO TABS
0.1000 mg | ORAL_TABLET | Freq: Every day | ORAL | Status: AC
  Administered 2014-09-29 – 2014-09-30 (×2): 0.1 mg via ORAL
  Filled 2014-09-25 (×2): qty 1

## 2014-09-25 MED ORDER — DICYCLOMINE HCL 20 MG PO TABS
20.0000 mg | ORAL_TABLET | Freq: Four times a day (QID) | ORAL | Status: AC | PRN
  Administered 2014-09-25 – 2014-09-29 (×8): 20 mg via ORAL
  Filled 2014-09-25 (×10): qty 1

## 2014-09-25 MED ORDER — CLONIDINE HCL 0.1 MG PO TABS
0.1000 mg | ORAL_TABLET | Freq: Four times a day (QID) | ORAL | Status: AC
  Administered 2014-09-25 – 2014-09-26 (×6): 0.1 mg via ORAL
  Filled 2014-09-25 (×7): qty 1

## 2014-09-25 MED ORDER — ALUM & MAG HYDROXIDE-SIMETH 200-200-20 MG/5ML PO SUSP
30.0000 mL | ORAL | Status: DC | PRN
  Administered 2014-09-28: 30 mL via ORAL
  Filled 2014-09-25: qty 30

## 2014-09-25 MED ORDER — MAGNESIUM HYDROXIDE 400 MG/5ML PO SUSP
30.0000 mL | Freq: Every day | ORAL | Status: DC | PRN
  Administered 2014-09-28: 30 mL via ORAL
  Filled 2014-09-25: qty 30

## 2014-09-25 MED ORDER — NAPROXEN 500 MG PO TABS
500.0000 mg | ORAL_TABLET | Freq: Two times a day (BID) | ORAL | Status: DC | PRN
  Administered 2014-09-26 – 2014-09-29 (×5): 500 mg via ORAL
  Filled 2014-09-25 (×5): qty 1

## 2014-09-25 MED ORDER — HYDROXYZINE HCL 25 MG PO TABS
25.0000 mg | ORAL_TABLET | Freq: Four times a day (QID) | ORAL | Status: AC | PRN
  Administered 2014-09-25 – 2014-09-29 (×6): 25 mg via ORAL
  Filled 2014-09-25 (×7): qty 1

## 2014-09-25 MED ORDER — TRAZODONE HCL 100 MG PO TABS
100.0000 mg | ORAL_TABLET | Freq: Every evening | ORAL | Status: DC | PRN
  Administered 2014-09-25 – 2014-09-26 (×3): 100 mg via ORAL
  Filled 2014-09-25 (×3): qty 1

## 2014-09-25 MED ORDER — METHOCARBAMOL 500 MG PO TABS
500.0000 mg | ORAL_TABLET | Freq: Three times a day (TID) | ORAL | Status: AC | PRN
  Administered 2014-09-25 – 2014-09-29 (×9): 500 mg via ORAL
  Filled 2014-09-25 (×9): qty 1

## 2014-09-25 MED ORDER — CITALOPRAM HYDROBROMIDE 40 MG PO TABS
40.0000 mg | ORAL_TABLET | Freq: Every day | ORAL | Status: DC
  Administered 2014-09-25 – 2014-10-02 (×8): 40 mg via ORAL
  Filled 2014-09-25 (×3): qty 1
  Filled 2014-09-25: qty 2
  Filled 2014-09-25: qty 7
  Filled 2014-09-25 (×3): qty 1
  Filled 2014-09-25: qty 2
  Filled 2014-09-25 (×3): qty 1

## 2014-09-25 NOTE — ED Notes (Signed)
[facility] transport requested. 

## 2014-09-25 NOTE — BHH Counselor (Signed)
Patient requested that CSW come back later to complete PSA. Patient reports that she feels unwell at this time.  Samuella Bruin, MSW, [employer] Clinical Social Worker [facility] [phone]

## 2014-09-25 NOTE — Progress Notes (Signed)
Pt did not attend AA group this evening.  

## 2014-09-25 NOTE — ED Notes (Signed)
Report called to RN Bonita Quin, [facility], pending [facility] transport.

## 2014-09-25 NOTE — Tx Team (Signed)
Initial Interdisciplinary Treatment Plan   PATIENT STRESSORS: Financial difficulties Substance abuse   PATIENT STRENGTHS: Ability for insight Average or above average intelligence General fund of knowledge Motivation for treatment/growth   PROBLEM LIST: Problem List/Patient Goals Date to be addressed Date deferred Reason deferred Estimated date of resolution  "I need help with my thoughts/ mood swings" 9/5/2016su     "i need help with my addiction" 09/25/2014     depression 09/25/2014     Risk for suicide 09/25/2014     anxiety 09/25/2014     Substance abuse 09/25/2014                        DISCHARGE CRITERIA:  Ability to meet basic life and health needs Improved stabilization in mood, thinking, and/or behavior Verbal commitment to aftercare and medication compliance Withdrawal symptoms are absent or subacute and managed without 24-hour nursing intervention  PRELIMINARY DISCHARGE PLAN: Attend PHP/IOP Outpatient therapy Placement in alternative living arrangements  PATIENT/FAMIILY INVOLVEMENT: This treatment plan has been presented to and reviewed with the patient, Brenda Stafford,  The patient and family have been given the opportunity to ask questions and make suggestions.  JEHU-APPIAH, Jory Welke K 09/25/2014, 2:59 AM

## 2014-09-25 NOTE — Progress Notes (Signed)
D: Pt presents with flat affect and depressed mood. Pt rates depression 9/10. Pt reports passive suicidal thoughts. Pt verbally contracts for safety and verbalized understanding of contract. Pt reports depression d/t not seeing her kids. Pt reported that her mother and father have joint custody of her children. Pt reports withdrawal symptoms of shakes, chills, sweats, abd cramps and tremors. Writer with notify Np that pt will need to be started on a protocol.  A: Medications reviewed and scheduled meds administered as ordered per MD. Verbal support given. Pt encouraged to attend groups. 15 minute checks performed for safety.  R: Pt receptive to tx.

## 2014-09-25 NOTE — Progress Notes (Signed)
D   Pt complains of some lethargy and said she just feels so tired and has been having trouble waking up for groups    She has minimal interaction with others   Pt bp is running low  A   Received instruction regarding increasing blood pressure   Encourage  Fluids  Verbal support given   Medications administered and effectiveness monitored   Q 15 min checks   Discussed withdrawal symptoms with patient  R   Pt safe at present and verbalizes understanding

## 2014-09-25 NOTE — Progress Notes (Signed)
Recreation Therapy Notes  Date: 09.05.2016 Time: 9:30am Location: [address] Group Room   Group Topic: Stress Management  Goal Area(s) Addresses:  Patient will actively participate in stress management techniques presented during session.   Behavioral Response: Did not attend.   [name] L [name], LRT/CTRS         Brenda Stafford L 09/25/2014 1:33 PM 

## 2014-09-25 NOTE — H&P (Signed)
Psychiatric Admission Assessment Adult  Patient Identification: Brenda Stafford  MRN:  [id number]  Date of Evaluation:  09/25/2014  Chief Complaint:  UNSPECIFIED BIPOLAR DISORDER  Principal Diagnosis: Opioid use disorder Diagnosis:   Patient Active Problem List   Diagnosis Date Noted  . Bipolar disorder [F31.9] 09/25/2014  . Substance induced mood disorder [F19.94] 09/24/2014  . 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 Cocaine use disorder, moderate, dependence [F14.20]    History of Present Illness: Brenda Stafford is a 29 year old Caucasian female. Admitted to [facility] from the [facility] with complaints of opioid addiction, requesting detoxification treatments. During this assessment, Brenda Stafford reports, "It was 2 days ago when I walked to the [facility] ED. I had an abcess tooth. While at the ED, I told them that I needed to detox from Heroin. I have used Heroin by injection everyday x 2 years. I was also using any other drugs I could get my hands on. This is my first time being inpatient for my addiction. I have borderline personality disorder & anxiety disorder as well. I was diagnosed with bipolar disorder about 2 years ago. I was put on gabapentin, Celexa & Trazodone. I had overdosed twice in my life time; one overdose was intentional, the other, accidental. I'm also a cutter. I cut to deal & cope with my depression if I was not using drugs. My longest sobriety is about 2 years & that was from 2010 to 2012. After my discharge from this hospital, I will be going back to [place]. I plan on going back to the [facility] for further treatment. Mental illness & substance abuse runs in my family. I don't sleep well at night".  Elements:  Location:  Opioid use disorder, severe. Quality:  Cold sweats, Diarrhea, abdomnial cramps, nausea, cravings. Severity:  Severe. Timing:  Current. Duration:  Chronic. Context:  "I have been using heroin everyday x 2 years, worsened my depression".  Associated  Signs/Symptoms:  Depression Symptoms:  depressed mood, insomnia, psychomotor agitation, feelings of worthlessness/guilt, difficulty concentrating, suicidal thoughts without plan, anxiety, loss of energy/fatigue,  (Hypo) Manic Symptoms:  Impulsivity, Irritable Mood, Labiality of Mood,  Anxiety Symptoms:  Excessive Worry,  Psychotic Symptoms:  Denies  PTSD Symptoms: NA  Total Time spent with patient: 1 hour  Past Medical History:  Past Medical History  Diagnosis Date  . Endocarditis   . Osteomyelitis   . Sciatica    History reviewed. No pertinent past surgical history.  Family History: History reviewed. No pertinent family history.  Social History:  History  Alcohol Use No     History  Drug Use  . Yes  . Special: Heroin, Cocaine    Comment: heroin    Social History   Social History  . Marital Status: Single    Spouse Name: N/A  . Number of Children: N/A  . Years of Education: N/A   Social History Main Topics  . Smoking status: Current Every Day Smoker -- 1.00 packs/day  . Smokeless tobacco: None  . Alcohol Use: No  . Drug Use: Yes    Special: Heroin, Cocaine     Comment: heroin  . Sexual Activity: Yes   Other Topics Concern  . None   Social History Narrative   Additional Social History:  Musculoskeletal: Strength & Muscle Tone: within normal limits Gait & Station: normal Patient leans: N/A  Psychiatric Specialty Exam: Physical Exam  Constitutional: She is oriented to person, place, and time. She appears well-developed and  well-nourished.  HENT:  Head: Normocephalic.  Eyes: Pupils are equal, round, and reactive to light.  Neck: Normal range of motion.  Cardiovascular: Normal rate.   Respiratory: Effort normal.  GI: Soft.  Genitourinary:  Denies any issues in this area   Musculoskeletal: Normal range of motion.  Neurological: She is alert and oriented to person, place, and time.  Skin: Skin is warm and dry.  Psychiatric: Her speech  is normal and behavior is normal. Judgment and thought content normal. Her mood appears anxious. Her affect is not angry, not blunt, not labile and not inappropriate. Cognition and memory are normal. She exhibits a depressed mood.    Review of Systems  Constitutional: Positive for chills, malaise/fatigue and diaphoresis.  HENT: Negative.   Eyes: Negative.   Respiratory: Negative.   Cardiovascular: Negative.   Gastrointestinal: Negative.   Genitourinary: Negative.   Musculoskeletal: Positive for myalgias and joint pain.  Skin: Negative.   Neurological: Positive for dizziness and weakness.  Endo/Heme/Allergies: Negative.   Psychiatric/Behavioral: Positive for depression, suicidal ideas (Denies any plans or intent) and substance abuse (Polysubstance dependence). Negative for hallucinations and memory loss. The patient is nervous/anxious and has insomnia.     Blood pressure 127/70, pulse 68, temperature 97.9 F (36.6 C), temperature source Oral, resp. rate 18, height 5' 3" (1.6 m), weight 67.132 kg (148 lb), last menstrual period 09/21/2014.Body mass index is 26.22 kg/(m^2).  General Appearance: Disheveled  Eye Sport and exercise psychologist::  Fair  Speech:  Clear and Coherent  Volume:  Normal  Mood:  Anxious, Depressed and Irritable  Affect:  Congruent and Flat  Thought Process:  Coherent  Orientation:  Full (Time, Place, and Person)  Thought Content:  Clear, denies any hallucination, delusions or paranoia.  Suicidal Thoughts:  Yes.  without intent/plan  Homicidal Thoughts:  No  Memory:  Grossly intact  Judgement:  Fair  Insight:  Fair  Psychomotor Activity:  Restlessness and Tremor  Concentration:  Fair  Recall:  Good  Fund of Knowledge:Fair  Language: Good  Akathisia:  No  Handed:  Right  AIMS (if indicated):     Assets:  Desire for Improvement  ADL's:  Intact  Cognition: WNL  Sleep:      Risk to Self: Is patient at risk for suicide?: Yes Risk to Others: No Prior Inpatient Therapy: Yes Prior  Outpatient Therapy: Yes  Alcohol Screening: 1. How often do you have a drink containing alcohol?: Monthly or less 2. How many drinks containing alcohol do you have on a typical day when you are drinking?: 1 or 2 3. How often do you have six or more drinks on one occasion?: Never Preliminary Score: 0 9. Have you or someone else been injured as a result of your drinking?: No 10. Has a relative or friend or a doctor or another health worker been concerned about your drinking or suggested you cut down?: No Alcohol Use Disorder Identification Test Final Score (AUDIT): 1 Brief Intervention: AUDIT score less than 7 or less-screening does not suggest unhealthy drinking-brief intervention not indicated  Allergies:   Allergies  Allergen Reactions  . Amoxicillin   . Bactrim [Sulfamethoxazole-Trimethoprim]   . Keflex [Cephalexin]   . Penicillins   . Tramadol    Lab Results:  Results for orders placed or performed during the hospital encounter of 09/23/14 (from the past 48 hour(s))  Comprehensive metabolic panel     Status: Abnormal   Collection Time: 09/23/14  8:25 PM  Result Value Ref Range   Sodium 139  135 - 145 mmol/L   Potassium 3.6 3.5 - 5.1 mmol/L   Chloride 109 101 - 111 mmol/L   CO2 23 22 - 32 mmol/L   Glucose, Bld 140 (H) 65 - 99 mg/dL   BUN 13 6 - 20 mg/dL   Creatinine, Ser 0.92 0.44 - 1.00 mg/dL   Calcium 9.7 8.9 - 10.3 mg/dL   Total Protein 8.0 6.5 - 8.1 g/dL   Albumin 4.4 3.5 - 5.0 g/dL   AST 38 15 - 41 U/L   ALT 37 14 - 54 U/L   Alkaline Phosphatase 61 38 - 126 U/L   Total Bilirubin 0.3 0.3 - 1.2 mg/dL   GFR calc non Af Amer >60 >60 mL/min   GFR calc Af Amer >60 >60 mL/min    Comment: (NOTE) The eGFR has been calculated using the CKD EPI equation. This calculation has not been validated in all clinical situations. eGFR's persistently <60 mL/min signify possible Chronic Kidney Disease.    Anion gap 7 5 - 15  Ethanol (ETOH)     Status: None   Collection Time: 09/23/14   8:25 PM  Result Value Ref Range   Alcohol, Ethyl (B) <5 <5 mg/dL    Comment:        LOWEST DETECTABLE LIMIT FOR SERUM ALCOHOL IS 5 mg/dL FOR MEDICAL PURPOSES ONLY   Salicylate level     Status: None   Collection Time: 09/23/14  8:25 PM  Result Value Ref Range   Salicylate Lvl 4.6 2.8 - 30.0 mg/dL  Acetaminophen level     Status: Abnormal   Collection Time: 09/23/14  8:25 PM  Result Value Ref Range   Acetaminophen (Tylenol), Serum <10 (L) 10 - 30 ug/mL    Comment:        THERAPEUTIC CONCENTRATIONS VARY SIGNIFICANTLY. A RANGE OF 10-30 ug/mL MAY BE AN EFFECTIVE CONCENTRATION FOR MANY PATIENTS. HOWEVER, SOME ARE BEST TREATED AT CONCENTRATIONS OUTSIDE THIS RANGE. ACETAMINOPHEN CONCENTRATIONS >150 ug/mL AT 4 HOURS AFTER INGESTION AND >50 ug/mL AT 12 HOURS AFTER INGESTION ARE OFTEN ASSOCIATED WITH TOXIC REACTIONS.   CBC     Status: Abnormal   Collection Time: 09/23/14  8:25 PM  Result Value Ref Range   WBC 8.0 4.0 - 10.5 K/uL   RBC 4.73 3.87 - 5.11 MIL/uL   Hemoglobin 15.3 (H) 12.0 - 15.0 g/dL   HCT 44.0 36.0 - 46.0 %   MCV 93.0 78.0 - 100.0 fL   MCH 32.3 26.0 - 34.0 pg   MCHC 34.8 30.0 - 36.0 g/dL   RDW 14.4 11.5 - 15.5 %   Platelets 295 150 - 400 K/uL  I-Stat beta hCG blood, ED ([facility], [facility], [facility] only)     Status: None   Collection Time: 09/23/14  8:29 PM  Result Value Ref Range   I-stat hCG, quantitative <5.0 <5 mIU/mL   Comment 3            Comment:   GEST. AGE      CONC.  (mIU/mL)   <=1 WEEK        5 - 50     2 WEEKS       50 - 500     3 WEEKS       100 - 10,000     4 WEEKS     1,000 - 30,000        FEMALE AND NON-PREGNANT FEMALE:     LESS THAN 5 mIU/mL   Urine rapid drug screen (hosp  performed) (Not at [facility])     Status: Abnormal   Collection Time: 09/23/14 10:45 PM  Result Value Ref Range   Opiates POSITIVE (A) NONE DETECTED   Cocaine POSITIVE (A) NONE DETECTED   Benzodiazepines NONE DETECTED NONE DETECTED   Amphetamines NONE DETECTED NONE DETECTED    Tetrahydrocannabinol NONE DETECTED NONE DETECTED   Barbiturates NONE DETECTED NONE DETECTED    Comment:        DRUG SCREEN FOR MEDICAL PURPOSES ONLY.  IF CONFIRMATION IS NEEDED FOR ANY PURPOSE, NOTIFY LAB WITHIN 5 DAYS.        LOWEST DETECTABLE LIMITS FOR URINE DRUG SCREEN Drug Class       Cutoff (ng/mL) Amphetamine      1000 Barbiturate      200 Benzodiazepine   081 Tricyclics       448 Opiates          300 Cocaine          300 THC              50    Current Medications: Current Facility-Administered Medications  Medication Dose Route Frequency Provider Last Rate Last Dose  . acetaminophen (TYLENOL) tablet 650 mg  650 mg Oral Q6H PRN Harriet Butte, NP      . alum & mag hydroxide-simeth (MAALOX/MYLANTA) 200-200-20 MG/5ML suspension 30 mL  30 mL Oral Q4H PRN Harriet Butte, NP      . citalopram (CELEXA) tablet 40 mg  40 mg Oral Daily Harriet Butte, NP   40 mg at 09/25/14 0801  . cloNIDine (CATAPRES) tablet 0.1 mg  0.1 mg Oral QID Encarnacion Slates, NP       Followed by  . [START ON 09/27/2014] cloNIDine (CATAPRES) tablet 0.1 mg  0.1 mg Oral BH-qamhs Encarnacion Slates, NP       Followed by  . [START ON 09/30/2014] cloNIDine (CATAPRES) tablet 0.1 mg  0.1 mg Oral QAC breakfast Encarnacion Slates, NP      . dicyclomine (BENTYL) tablet 20 mg  20 mg Oral Q6H PRN Encarnacion Slates, NP      . gabapentin (NEURONTIN) capsule 900 mg  900 mg Oral TID Harriet Butte, NP   900 mg at 09/25/14 0801  . hydrOXYzine (ATARAX/VISTARIL) tablet 25 mg  25 mg Oral Q6H PRN Encarnacion Slates, NP      . loperamide (IMODIUM) capsule 2-4 mg  2-4 mg Oral PRN Encarnacion Slates, NP      . magnesium hydroxide (MILK OF MAGNESIA) suspension 30 mL  30 mL Oral Daily PRN Harriet Butte, NP      . methocarbamol (ROBAXIN) tablet 500 mg  500 mg Oral Q8H PRN Encarnacion Slates, NP      . naproxen (NAPROSYN) tablet 500 mg  500 mg Oral BID PRN Encarnacion Slates, NP      . nicotine (NICODERM CQ - dosed in mg/24 hours) patch 21 mg  21 mg Transdermal Daily  Harriet Butte, NP   21 mg at 09/25/14 0801  . ondansetron (ZOFRAN-ODT) disintegrating tablet 4 mg  4 mg Oral Q6H PRN Encarnacion Slates, NP      . traZODone (DESYREL) tablet 100 mg  100 mg Oral QHS PRN Harriet Butte, NP   100 mg at 09/25/14 0226   PTA Medications: Prescriptions prior to admission  Medication Sig Dispense Refill Last Dose  . citalopram (CELEXA) 40 MG tablet Take 1 tablet by mouth daily.  1 Not Taking at Unknown time  . clindamycin (CLEOCIN) 150 MG capsule Take 3 capsules (450 mg total) by mouth 3 (three) times daily. May dispense as 160m capsules (Patient not taking: Reported on 09/23/2014) 90 capsule 0 Not Taking at Unknown time  . gabapentin (NEURONTIN) 300 MG capsule Take 3 capsules by mouth 3 (three) times daily.  0 unknown  . nicotine (NICODERM CQ - DOSED IN MG/24 HOURS) 21 mg/24hr patch Place 1 patch (21 mg total) onto the skin daily. 28 patch 0 09/23/2014 at Unknown time   Previous Psychotropic Medications: Yes   Substance Abuse History in the last 12 months:  Yes.    Consequences of Substance Abuse: Medical Consequences:  Liver damage, Possible death by overdose Legal Consequences:  Arrests, jail time, Loss of driving privilege. Family Consequences:  Family discord, divorce and or separation.  Results for orders placed or performed during the hospital encounter of 09/23/14 (from the past 72 hour(s))  Comprehensive metabolic panel     Status: Abnormal   Collection Time: 09/23/14  8:25 PM  Result Value Ref Range   Sodium 139 135 - 145 mmol/L   Potassium 3.6 3.5 - 5.1 mmol/L   Chloride 109 101 - 111 mmol/L   CO2 23 22 - 32 mmol/L   Glucose, Bld 140 (H) 65 - 99 mg/dL   BUN 13 6 - 20 mg/dL   Creatinine, Ser 0.92 0.44 - 1.00 mg/dL   Calcium 9.7 8.9 - 10.3 mg/dL   Total Protein 8.0 6.5 - 8.1 g/dL   Albumin 4.4 3.5 - 5.0 g/dL   AST 38 15 - 41 U/L   ALT 37 14 - 54 U/L   Alkaline Phosphatase 61 38 - 126 U/L   Total Bilirubin 0.3 0.3 - 1.2 mg/dL   GFR calc non Af Amer  >60 >60 mL/min   GFR calc Af Amer >60 >60 mL/min    Comment: (NOTE) The eGFR has been calculated using the CKD EPI equation. This calculation has not been validated in all clinical situations. eGFR's persistently <60 mL/min signify possible Chronic Kidney Disease.    Anion gap 7 5 - 15  Ethanol (ETOH)     Status: None   Collection Time: 09/23/14  8:25 PM  Result Value Ref Range   Alcohol, Ethyl (B) <5 <5 mg/dL    Comment:        LOWEST DETECTABLE LIMIT FOR SERUM ALCOHOL IS 5 mg/dL FOR MEDICAL PURPOSES ONLY   Salicylate level     Status: None   Collection Time: 09/23/14  8:25 PM  Result Value Ref Range   Salicylate Lvl 4.6 2.8 - 30.0 mg/dL  Acetaminophen level     Status: Abnormal   Collection Time: 09/23/14  8:25 PM  Result Value Ref Range   Acetaminophen (Tylenol), Serum <10 (L) 10 - 30 ug/mL    Comment:        THERAPEUTIC CONCENTRATIONS VARY SIGNIFICANTLY. A RANGE OF 10-30 ug/mL MAY BE AN EFFECTIVE CONCENTRATION FOR MANY PATIENTS. HOWEVER, SOME ARE BEST TREATED AT CONCENTRATIONS OUTSIDE THIS RANGE. ACETAMINOPHEN CONCENTRATIONS >150 ug/mL AT 4 HOURS AFTER INGESTION AND >50 ug/mL AT 12 HOURS AFTER INGESTION ARE OFTEN ASSOCIATED WITH TOXIC REACTIONS.   CBC     Status: Abnormal   Collection Time: 09/23/14  8:25 PM  Result Value Ref Range   WBC 8.0 4.0 - 10.5 K/uL   RBC 4.73 3.87 - 5.11 MIL/uL   Hemoglobin 15.3 (H) 12.0 - 15.0 g/dL   HCT 44.0 36.0 -  46.0 %   MCV 93.0 78.0 - 100.0 fL   MCH 32.3 26.0 - 34.0 pg   MCHC 34.8 30.0 - 36.0 g/dL   RDW 14.4 11.5 - 15.5 %   Platelets 295 150 - 400 K/uL  I-Stat beta hCG blood, ED ([facility], [facility], [facility] only)     Status: None   Collection Time: 09/23/14  8:29 PM  Result Value Ref Range   I-stat hCG, quantitative <5.0 <5 mIU/mL   Comment 3            Comment:   GEST. AGE      CONC.  (mIU/mL)   <=1 WEEK        5 - 50     2 WEEKS       50 - 500     3 WEEKS       100 - 10,000     4 WEEKS     1,000 - 30,000        FEMALE AND  NON-PREGNANT FEMALE:     LESS THAN 5 mIU/mL   Urine rapid drug screen (hosp performed) (Not at [facility])     Status: Abnormal   Collection Time: 09/23/14 10:45 PM  Result Value Ref Range   Opiates POSITIVE (A) NONE DETECTED   Cocaine POSITIVE (A) NONE DETECTED   Benzodiazepines NONE DETECTED NONE DETECTED   Amphetamines NONE DETECTED NONE DETECTED   Tetrahydrocannabinol NONE DETECTED NONE DETECTED   Barbiturates NONE DETECTED NONE DETECTED    Comment:        DRUG SCREEN FOR MEDICAL PURPOSES ONLY.  IF CONFIRMATION IS NEEDED FOR ANY PURPOSE, NOTIFY LAB WITHIN 5 DAYS.        LOWEST DETECTABLE LIMITS FOR URINE DRUG SCREEN Drug Class       Cutoff (ng/mL) Amphetamine      1000 Barbiturate      200 Benzodiazepine   945 Tricyclics       038 Opiates          300 Cocaine          300 THC              50     Observation Level/Precautions:  15 minute checks  Laboratory:  Per ED, UDS positive for opioid  Psychotherapy: Group sessions  Medications: See medication lists  Consultations: as needed  Discharge Concerns: Mood stability, sobriety   Estimated LOS: 3-5 days  Other:     Psychological Evaluations: Yes   Treatment Plan Summary: Daily contact with patient to assess and evaluate symptoms and progress in treatment and Medication management: 1. Admit for crisis management and stabilization, estimated length of stay 3-5 days.  2. Medication management to reduce current symptoms to base line and improve the patient's overall level of functioning; initiate opioid detox protocols, resume Gabapentin 900 mg for agitation, Citalopram 40 mg for depression &Trazodone 100 mg for insomnia. 3. Treat health problems as indicated.  4. Develop treatment plan to decrease risk of relapse upon discharge and the need for readmission.  5. Psycho-social education regarding relapse prevention and self care.  6. Health care follow up as needed for medical problems.  7. Review, reconcile, and reinstate any  pertinent home medications for other health issues where appropriate. 8. Call for consults with hospitalist for any additional specialty patient care services as needed.  Medical Decision Making:  New problem, with additional work up planned, Review of Psycho-Social Stressors (1), Review or order clinical lab tests (1), Review and  summation of old records (2), Review of Medication Regimen & Side Effects (2) and Review of New Medication or Change in Dosage (2)  I certify that inpatient services furnished can reasonably be expected to improve the patient's condition.   Encarnacion Slates, PMHNP, FNP-BC 9/5/201611:20 AM
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
## 2014-09-25 NOTE — Progress Notes (Signed)
Patient ID: Brenda Stafford, female   DOB: [birth year], 29 y.o.   MRN: [id number] Admission note: D:Patient is a voluntary admission in no acute distress for cocaine and opoid abuse. Pt reports she shoot up everything she can get her hands on. Pt has a hx of cutting and burning herself. Pt endorses suicidal ideation with plan to slit her throat but denies it on admission. Pt  denies HI/AVH. Pt reported hx of endocarditis from drug use.   A: Pt admitted to unit per protocol, skin assessment and belonging search done.   Consent signed by pt. Pt educated on therapeutic milieu rules. Pt was introduced to milieu by nursing staff. Fall risk safety plan explained to the patient. 15 minutes checks started for safety.  R: Pt was receptive to education. Writer offered support.

## 2014-09-25 NOTE — [facility] Group Notes (Signed)
[facility] LCSW Aftercare Discharge Planning Group Note  09/25/2014  8:45 AM  Participation Quality: Did Not Attend. Patient invited to participate but declined.  Eddye Broxterman, MSW, LCSWA Clinical Social Worker [facility] [phone]   

## 2014-09-26 DIAGNOSIS — F1120 Opioid dependence, uncomplicated: Secondary | ICD-10-CM

## 2014-09-26 MED ORDER — BUSPIRONE HCL 10 MG PO TABS
10.0000 mg | ORAL_TABLET | Freq: Three times a day (TID) | ORAL | Status: DC
  Administered 2014-09-26 – 2014-09-29 (×7): 10 mg via ORAL
  Filled 2014-09-26 (×10): qty 1
  Filled 2014-09-26: qty 2
  Filled 2014-09-26: qty 1

## 2014-09-26 MED ORDER — LORAZEPAM 1 MG PO TABS
ORAL_TABLET | ORAL | Status: AC
  Filled 2014-09-26: qty 2

## 2014-09-26 MED ORDER — TRAZODONE HCL 100 MG PO TABS
100.0000 mg | ORAL_TABLET | Freq: Every evening | ORAL | Status: DC | PRN
  Administered 2014-09-26 – 2014-09-27 (×3): 100 mg via ORAL
  Filled 2014-09-26 (×2): qty 1

## 2014-09-26 MED ORDER — LORAZEPAM 1 MG PO TABS
2.0000 mg | ORAL_TABLET | Freq: Once | ORAL | Status: AC
  Administered 2014-09-26: 2 mg via ORAL

## 2014-09-26 MED ORDER — TRAZODONE HCL 100 MG PO TABS
ORAL_TABLET | ORAL | Status: AC
  Filled 2014-09-26: qty 1

## 2014-09-26 NOTE — Progress Notes (Signed)
D   Pt complains of some lethargy and said she just feels so tired and has been having trouble waking up for groups    She has been more alert this evening and interactive   She complains of moderate irritability is fidgety and anxious  She informed RN that she usually takes clonopin daily and the trazadone wasn't helping her to sleep   Pt bp is running low  A   Received instruction regarding increasing blood pressure   Encourage  Fluids  Verbal support given   Medications administered and effectiveness monitored   Q 15 min checks   Discussed withdrawal symptoms with patient    Encouraged pt to speak with her doctor about her anxiety and withdrawal and her sleep medications   Received orders for one time medication to help her relax through the night R   Pt safe at present and verbalizes understanding   Pt agreed to talk to her doctor

## 2014-09-26 NOTE — BHH Counselor (Signed)
CSW attempted to complete PSA. Patient would not wake up to speak with CSW. CSW will attempt PSA at a later time.   Majestic Brister, MSW, LCSWA Clinical Social Worker [facility] [phone]  

## 2014-09-26 NOTE — BHH Counselor (Signed)
CSW attempted to complete PSA. Patient would not wake up to speak with CSW. CSW will attempt PSA at a later time.   Samuella Bruin, MSW, [employer] Clinical Social Worker [facility] [phone]

## 2014-09-26 NOTE — BHH Group Notes (Signed)
BHH LCSW Group Therapy 09/26/2014  1:15 PM   Type of Therapy: Group Therapy  Participation Level: Did Not Attend. Patient invited to participate but declined.   Jarett Dralle, MSW, LCSWA Clinical Social Worker [facility] [phone]   

## 2014-09-26 NOTE — Tx Team (Signed)
Interdisciplinary Treatment Plan Update (Adult) Date: 09/26/14   Time Reviewed: 9:30 AM  Progress in Treatment: Attending groups: No Participating in groups: No Taking medication as prescribed: Yes Tolerating medication: Yes Family/Significant other contact made: No, CSW assessing for appropriate contacts Patient understands diagnosis: Yes Discussing patient identified problems/goals with staff: Yes Medical problems stabilized or resolved: Yes Denies suicidal/homicidal ideation: Yes Issues/concerns per patient self-inventory: Yes Other:  New problem(s) identified: N/A  Discharge Plan or Barriers: Patient requesting referral to residential treatment. Patient currently homeless with limited supports and no income.    Reason for Continuation of Hospitalization:  Depression Anxiety Medication Stabilization   Comments: N/A  Estimated length of stay: 3-5 days   Comments:  Brenda Stafford is a 29 year old Caucasian female. Admitted to [facility] from the [facility] with complaints of opioid addiction, requesting detoxification treatments. During this assessment, Brenda Stafford reports, "It was 2 days ago when I walked to the [facility] ED. I had an abcess tooth. While at the ED, I told them that I needed to detox from Heroin. I have used Heroin by injection everyday x 2 years. I was also using any other drugs I could get my hands on. This is my first time being inpatient for my addiction. I have borderline personality disorder & anxiety disorder as well. I was diagnosed with bipolar disorder about 2 years ago. I was put on gabapentin, Celexa & Trazodone. I had overdosed twice in my life time; one overdose was intentional, the other, accidental. I'm also a cutter. I cut to deal & cope with my depression if I was not using drugs. My longest sobriety is about 2 years & that was from 2010 to 2012. After my discharge from this hospital, I will be going back to [place]. I plan on going back to  the [facility] for further treatment. Mental illness & substance abuse runs in my family. I don't sleep well at night".     Review of initial/current patient goals per problem list:  1. Goal(s): Patient will participate in aftercare plan   Met: No   Target date: 3-5 days post admission date   As evidenced by: Patient will participate within aftercare plan AEB aftercare provider and housing plan at discharge being identified.  9/6: Goal not met: CSW assessing for appropriate referrals for pt and will have follow up secured prior to d/c.     2. Goal (s): Patient will exhibit decreased depressive symptoms and suicidal ideations.   Met: No   Target date: 3-5 days post admission date   As evidenced by: Patient will utilize self rating of depression at 3 or below and demonstrate decreased signs of depression or be deemed stable for discharge by MD.  9/6: Goal not met: Pt presents with flat affect and depressed mood.  Pt admitted with depression rating of 10.  Pt to show decreased sign of depression and a rating of 3 or less before d/c.      3. Goal(s): Patient will demonstrate decreased signs and symptoms of anxiety.   Met: No   Target date: 3-5 days post admission date   As evidenced by: Patient will utilize self rating of anxiety at 3 or below and demonstrated decreased signs of anxiety, or be deemed stable for discharge by MD  9/6: Goal not met: Pt presents with anxious mood and affect.  Pt admitted with anxiety rating of 10.  Pt to show decreased sign of anxiety and a  rating of 3 or less before d/c.    4. Goal(s): Patient will demonstrate decreased signs of withdrawal due to substance abuse   Met: No   Target date: 3-5 days post admission date  9/6: Patient with COWS score of 5 today, experiencing anxiety, tremor and GI upset.     Attendees: Patient:    Family:    Physician: Dr. Parke Poisson; Dr. Sabra Heck 09/26/2014 9:30 AM  Nursing: Darrol Angel, Alric Ran, RN 09/26/2014 9:30 AM  Clinical Social Worker: Tilden Fossa, [place] 09/26/2014 9:30 AM  Other: Louie Bun Smart LCSWA 09/26/2014 9:30 AM  Other: Lucinda Dell, Beverly Sessions Liaison 09/26/2014 9:30 AM  Other: Lars Pinks, Case Manager 09/26/2014 9:30 AM  Other: Ave Filter, NP 09/26/2014 9:30 AM  Other:    Other:    Other:           Scribe for Treatment Team:  Tilden Fossa, MSW, [employer] [phone]
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
## 2014-09-26 NOTE — Progress Notes (Signed)
Recreation Therapy Notes  Animal-Assisted Activity (AAA) Program Checklist/Progress Notes Patient Eligibility Criteria Checklist & Daily Group note for Rec Tx Intervention  Date: 09.06.2016 Time: 2:45pm Location: [address]    AAA/T Program Assumption of Risk Form signed by Patient/ or Parent Legal Guardian yes  Patient is free of allergies or sever asthma yes  Patient reports no fear of animals yes  Patient reports no history of cruelty to animals yes  Patient understands his/her participation is voluntary yes  Behavioral Response: Did not attend.   Marykay Lex Ahni Bradwell, LRT/CTRS  Vianna Venezia L 09/26/2014 3:06 PM

## 2014-09-26 NOTE — Progress Notes (Signed)
D: Pt presents with flat affect and depressed mood. Pt have minimal interaction on the unit and forwards little information. Pt isolates in here room and do not attend groups. Pt reported decreased energy level. Pt appears disheveled with poor hygiene. Pt rates depression 8/10. Anxiety 8/10. Pt endorses passive SI. Pt verbally contracts not to harm self. Pt verbalized understanding of SI contract. Pt experiencing withdrawal symptoms of chills, sweats, shakes and body aches.  A: Medications administered as ordered per MD. Verbal support given. Pt encouraged to attend groups. 15 minute checks performed for safety. R: Pt receptive to Tx

## 2014-09-26 NOTE — Progress Notes (Signed)
Adult Psychoeducational Group Note  Date:  09/26/2014 Time:  9:37 PM  Group Topic/Focus:  Wrap-Up Group:   The focus of this group is to help patients review their daily goal of treatment and discuss progress on daily workbooks.  Participation Level:  Active  Participation Quality:  Appropriate  Affect:  Appropriate  Cognitive:  Appropriate  Insight: Appropriate  Engagement in Group:  Engaged  Modes of Intervention:  Discussion  Additional Comments:   Octavio Manns 09/26/2014, 9:37 PM

## 2014-09-27 MED ORDER — TRAZODONE HCL 50 MG PO TABS
50.0000 mg | ORAL_TABLET | Freq: Every evening | ORAL | Status: DC | PRN
  Filled 2014-09-27: qty 1

## 2014-09-27 MED ORDER — LIDOCAINE 5 % EX PTCH
2.0000 | MEDICATED_PATCH | CUTANEOUS | Status: DC
Start: 2014-09-27 — End: 2014-10-02
  Administered 2014-09-27 – 2014-10-01 (×5): 2 via TRANSDERMAL
  Filled 2014-09-27 (×4): qty 2
  Filled 2014-09-27: qty 14
  Filled 2014-09-27 (×4): qty 2

## 2014-09-27 MED ORDER — LORAZEPAM 1 MG PO TABS
1.0000 mg | ORAL_TABLET | Freq: Four times a day (QID) | ORAL | Status: DC | PRN
  Administered 2014-09-27 – 2014-10-01 (×9): 1 mg via ORAL
  Filled 2014-09-27 (×10): qty 1

## 2014-09-27 NOTE — Progress Notes (Signed)
Recreation Therapy Notes  Date: 09.07.2016 Time: 9:30am Location: [address] Group Room   Group Topic: Stress Management  Goal Area(s) Addresses:  Patient will actively participate in stress management techniques presented during session.   Behavioral Response: Did not attend.   Sadler Teschner L Mayson Mcneish, LRT/CTRS        Alayha Babineaux L 09/27/2014 3:02 PM 

## 2014-09-27 NOTE — BHH Group Notes (Signed)
BHH LCSW Group Therapy 09/27/2014  1:15 PM Type of Therapy: Group Therapy Participation Level: Active  Participation Quality: Attentive, Sharing and Supportive  Affect: Appropriate  Cognitive: Alert and Oriented  Insight: Developing/Improving and Engaged  Engagement in Therapy: Developing/Improving and Engaged  Modes of Intervention: Clarification, Confrontation, Discussion, Education, Exploration, Limit-setting, Orientation, Problem-solving, Rapport Building, Reality Testing, Socialization and Support  Summary of Progress/Problems: The topic for group today was emotional regulation. This group focused on both positive and negative emotion identification and allowed group members to process ways to identify feelings, regulate negative emotions, and find healthy ways to manage internal/external emotions. Group members were asked to reflect on a time when their reaction to an emotion led to a negative outcome and explored how alternative responses using emotion regulation would have benefited them. Group members were also asked to discuss a time when emotion regulation was utilized when a negative emotion was experienced.  Brenda Stafford, MSW, LCSWA Clinical Social Worker [facility] [phone]    
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
## 2014-09-27 NOTE — Progress Notes (Signed)
Pt attended NA group this evening.  

## 2014-09-27 NOTE — Progress Notes (Signed)
Patient ID: Brenda Stafford, female   DOB: 1985/05/20, 29 y.o.   MRN: [id number]  Pt currently presents with a flat affect and anxious behavior. Pt hard to arouse this morning. Pt stayed in bed until 1300. Pt electively did not eat breakfast or lunch. Pt exhibits frequent medication seeking behaviors. When told her lidocaine patch was scheduled for 2000, pt states "I told the DR I need it now! Not at 8."   Pt provided with medications per providers orders. Pt's labs and vitals were monitored throughout the day. Pt supported emotionally and encouraged to express concerns and questions. Pt educated on medications and stress relief techniques.  Pt's safety ensured with 15 minute and environmental checks. Pt currently denies SI/HI and A/V hallucinations. Pt verbally agrees to seek staff if SI/HI or A/VH occurs and to consult with staff before acting on these thoughts. Will continue POC.

## 2014-09-27 NOTE — Progress Notes (Signed)
[facility] MD Progress Note  09/27/2014 2:07 PM Brenda Stafford  MRN:  [id number] Subjective:  [name] endorses that she is still not feeling well.she is having restless leg, aches pains. She is wanting to get her life back together. She states she has been using for so long and so much that she needs to go to a residential treatment if she was to be successful. States she is under all this pressure that she has to do well in order to have her children in her life Principal Problem: MDD (major depressive disorder), recurrent episode, severe Diagnosis:   Patient Active Problem List   Diagnosis Date Noted  . MDD (major depressive disorder), recurrent episode, severe [F33.2] 09/25/2014  . H/O borderline personality disorder [Z86.59] 09/25/2014  . Opioid use disorder, moderate, dependence [F11.20] 09/25/2014  . 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 Cocaine use disorder, moderate, dependence [F14.20]    Total Time spent with patient: 30 minutes   Past Medical History:  Past Medical History  Diagnosis Date  . Endocarditis   . Osteomyelitis   . Sciatica    History reviewed. No pertinent past surgical history. Family History: History reviewed. No pertinent family history. Social History:  History  Alcohol Use No     History  Drug Use  . Yes  . Special: Heroin, Cocaine    Comment: heroin    Social History   Social History  . Marital Status: Single    Spouse Name: N/A  . Number of Children: N/A  . Years of Education: N/A   Social History Main Topics  . Smoking status: Current Every Day Smoker -- 1.00 packs/day  . Smokeless tobacco: None  . Alcohol Use: No  . Drug Use: Yes    Special: Heroin, Cocaine     Comment: heroin  . Sexual Activity: Yes   Other Topics Concern  . None   Social History Narrative   Additional History:    Sleep: Fair  Appetite:  Poor   Assessment:   Musculoskeletal: Strength & Muscle Tone: within normal limits Gait & Station: normal Patient leans:  normal   Psychiatric Specialty Exam: Physical Exam  Review of Systems  Constitutional: Positive for malaise/fatigue.  HENT: Negative.   Eyes: Negative.   Respiratory: Negative.   Cardiovascular: Negative.   Gastrointestinal: Negative.   Genitourinary: Negative.   Musculoskeletal: Positive for myalgias.  Skin: Negative.   Neurological: Positive for weakness.  Endo/Heme/Allergies: Negative.   Psychiatric/Behavioral: Positive for substance abuse. The patient is nervous/anxious.     Blood pressure 94/69, pulse 63, temperature 98.2 F (36.8 C), temperature source Oral, resp. rate 16, height  (1.6 m), weight 67.132 kg (148 lb), last menstrual period 09/21/2014.Body mass index is 26.22 kg/(m^2).  General Appearance: Fairly Groomed  Patent attorney::  Fair  Speech:  Clear and Coherent  Volume:  Decreased  Mood:  Anxious and Depressed  Affect:  Depressed  Thought Process:  Coherent and Goal Directed  Orientation:  Full (Time, Place, and Person)  Thought Content:  symptoms events worries concerns  Suicidal Thoughts:  No  Homicidal Thoughts:  No  Memory:  Immediate;   Fair Recent;   Fair Remote;   Fair  Judgement:  Fair  Insight:  Present  Psychomotor Activity:  Restlessness  Concentration:  Fair  Recall:  [employer] of Knowledge:Fair  Language: Fair  Akathisia:  No  Handed:  Right  AIMS (if indicated):     Assets:  Desire for Improvement  ADL's:  Intact  Cognition: WNL  Sleep:  Number of Hours: 5.75     Current Medications: Current Facility-Administered Medications  Medication Dose Route Frequency Provider Last Rate Last Dose  . acetaminophen (TYLENOL) tablet 650 mg  650 mg Oral Q6H PRN Worthy Flank, NP      . alum & mag hydroxide-simeth (MAALOX/MYLANTA) 200-200-20 MG/5ML suspension 30 mL  30 mL Oral Q4H PRN Worthy Flank, NP      . busPIRone (BUSPAR) tablet 10 mg  10 mg Oral TID Rachael Fee, MD   10 mg at 09/27/14 1326  . citalopram (CELEXA) tablet 40 mg  40  mg Oral Daily Worthy Flank, NP   40 mg at 09/27/14 0915  . cloNIDine (CATAPRES) tablet 0.1 mg  0.1 mg Oral BH-qamhs Sanjuana Kava, NP   0.1 mg at 09/27/14 0800   Followed by  . [START ON 09/29/2014] cloNIDine (CATAPRES) tablet 0.1 mg  0.1 mg Oral QAC breakfast Sanjuana Kava, NP      . dicyclomine (BENTYL) tablet 20 mg  20 mg Oral Q6H PRN Sanjuana Kava, NP   20 mg at 09/27/14 1331  . gabapentin (NEURONTIN) capsule 900 mg  900 mg Oral TID Worthy Flank, NP   900 mg at 09/27/14 1326  . hydrOXYzine (ATARAX/VISTARIL) tablet 25 mg  25 mg Oral Q6H PRN Sanjuana Kava, NP   25 mg at 09/26/14 2120  . loperamide (IMODIUM) capsule 2-4 mg  2-4 mg Oral PRN Sanjuana Kava, NP      . magnesium hydroxide (MILK OF MAGNESIA) suspension 30 mL  30 mL Oral Daily PRN Worthy Flank, NP      . methocarbamol (ROBAXIN) tablet 500 mg  500 mg Oral Q8H PRN Sanjuana Kava, NP   500 mg at 09/27/14 1331  . naproxen (NAPROSYN) tablet 500 mg  500 mg Oral BID PRN Sanjuana Kava, NP   500 mg at 09/26/14 1650  . nicotine (NICODERM CQ - dosed in mg/24 hours) patch 21 mg  21 mg Transdermal Daily Worthy Flank, NP   21 mg at 09/27/14 0800  . ondansetron (ZOFRAN-ODT) disintegrating tablet 4 mg  4 mg Oral Q6H PRN Sanjuana Kava, NP   4 mg at 09/26/14 1650  . traZODone (DESYREL) tablet 100 mg  100 mg Oral QHS PRN,MR X 1 Kerry Hough, PA-C   100 mg at 09/26/14 2234    Lab Results: No results found for this or any previous visit (from the past 48 hour(s)).  Physical Findings: AIMS: Facial and Oral Movements Muscles of Facial Expression: None, normal Lips and Perioral Area: None, normal Jaw: None, normal Tongue: None, normal,Extremity Movements Upper (arms, wrists, hands, fingers): None, normal Lower (legs, knees, ankles, toes): None, normal, Trunk Movements Neck, shoulders, hips: None, normal, Overall Severity Severity of abnormal movements (highest score from questions above): None, normal Incapacitation due to abnormal  movements: None, normal Patient's awareness of abnormal movements (rate only patient's report): No Awareness, Dental Status Current problems with teeth and/or dentures?: Yes Does patient usually wear dentures?: No  CIWA:  CIWA-Ar Total: 1 COWS:  COWS Total Score: 6  Treatment Plan Summary: Daily contact with patient to assess and evaluate symptoms and progress in treatment and Medication management Supportive approach/coping skills Opioid dependence; continue Clonidine detox protocol  Work a relapse prevention plan Depression; continue to work with the Celexa up to 40 mg daily Anxiety; work with the Buspar 10 mg TID Anxiety/mood instability; continue  to work with the Neurontin high dosages Explore residential treatment options Medical Decision Making:  Review of Psycho-Social Stressors (1), Review or order clinical lab tests (1), Review of Medication Regimen & Side Effects (2) and Review of New Medication or Change in Dosage (2)     Hailey Miles A 09/27/2014, 2:07 PM

## 2014-09-27 NOTE — Progress Notes (Signed)
D: Pt endorses SI, depression, and anxiety. Pt reports withdrawal symptoms of cramping, nasal stuffiness,muscle spasms and nausea. Pt continues to present with med seeking behaviors.  Pt is visible and active within the milieu.  A: Writer administered scheduled and prn medications to pt, per MD orders. Continued support and availability as needed was extended to this pt. Staff continue to monitor pt with q7min checks.  R: No adverse drug reactions noted. Pt receptive to treatment. Pt remains safe at this time.

## 2014-09-28 DIAGNOSIS — F332 Major depressive disorder, recurrent severe without psychotic features: Principal | ICD-10-CM

## 2014-09-28 LAB — PREGNANCY, URINE: PREG TEST UR: NEGATIVE

## 2014-09-28 MED ORDER — BISACODYL 5 MG PO TBEC
5.0000 mg | DELAYED_RELEASE_TABLET | Freq: Once | ORAL | Status: AC
  Administered 2014-09-28: 5 mg via ORAL
  Filled 2014-09-28 (×2): qty 1

## 2014-09-28 MED ORDER — IBUPROFEN 600 MG PO TABS
600.0000 mg | ORAL_TABLET | Freq: Once | ORAL | Status: DC
  Filled 2014-09-28: qty 1

## 2014-09-28 MED ORDER — ZOLPIDEM TARTRATE 5 MG PO TABS
5.0000 mg | ORAL_TABLET | Freq: Every day | ORAL | Status: DC
  Administered 2014-09-28: 5 mg via ORAL
  Filled 2014-09-28: qty 1

## 2014-09-28 NOTE — Progress Notes (Signed)
D: Pt presents flat in affect and depressed in mood. Pt continues to be focused on medications. " I want to get everything I can get at one time". This statement was made to writer during the 7p-7a shift on 09/27/14 as well. Pt reports a decrease in the frequency of her suicidal thoughts. Pt is able to verbally contract for safety. Pt encouraged to speak with the psychiatrist in regards to her ongoing tooth pain.   A: Writer administered scheduled and prn medications to pt, per MD orders. Continued support and availability as needed was extended to this pt. Staff continue to monitor pt with q39min checks.  R: No adverse drug reactions noted. Pt receptive to treatment. Pt remains safe at this time.

## 2014-09-28 NOTE — [facility] Group Notes (Signed)
[facility] LCSW Group Therapy  09/28/2014 2:27 PM  Type of Therapy:  Group Therapy  Participation Level:  Did Not Attend-pt in room resting.   Summary of Progress/Problems:  Finding Balance in Life. Today's group focused on defining balance in one's own words, identifying things that can knock one off balance, and exploring healthy ways to maintain balance in life. Group members were asked to provide an example of a time when they felt off balance, describe how they handled that situation,and process healthier ways to regain balance in the future. Group members were asked to share the most important tool for maintaining balance that they learned while at [facility] and how they plan to apply this method after discharge.   Smart, Timoteo Carreiro LCSWA  09/28/2014, 2:27 PM

## 2014-09-28 NOTE — Progress Notes (Signed)
Pt stated that she has been feeling depressed most of the day which has caused her to sleep a lot. Pt also stated that her goal for tomorrow is to participate in groups and find a treatment center to go to.

## 2014-09-28 NOTE — Progress Notes (Addendum)
Patient ID: Brenda Stafford, female   DOB: 04-22-1985, 29 y.o.   MRN: [id number]   Pt currently presents with a flat affect and depressed, manipulative behavior. Pt frequently aks for medications states, "I want to get everything I can get all at one time, so I don't have to keep coming up here." Pt remains in bed for most of the day today after morning medication pass. Pt slept through lunch and approaches writer at 1330 asking for a meal tray or sandwich, pt states at that time "I'm not going to dinner, I feel bad."   Pt provided with medications per providers orders. Pt's labs and vitals were monitored throughout the day. Pt supported emotionally and encouraged to express concerns and questions. Pt educated on medications. UA collected per orders. Md notified of pt tooth pain.   Pt's safety ensured with 15 minute and environmental checks. Pt currently denies SI/HI and A/V hallucinations. Pt verbally agrees to seek staff if SI/HI or A/VH occurs and to consult with staff before acting on these thoughts. Pt complains of somach pain today. Pt unable to identify one daily goal to accomplish today, states "I guess getting Tramadol for my tooth pain." Will continue POC.

## 2014-09-28 NOTE — Progress Notes (Signed)
[facility] MD Progress Note  09/28/2014 1:20 PM Brenda Stafford  MRN:  [id number] Subjective:  Zakyla states " I am not sleeping , i sleep during the day because I do not sleep at night. I need my ativan.'  Objective:Brenda Stafford is a 29 year old Caucasian female. Admitted to [facility] from the [facility] with complaints of opioid addiction, requesting detoxification treatments. Pt seen today and I have reviewed Dr.Lugo's previous notes in EHR . Pt seen today as drowsy , barely opened her eyes during evaluation, had to be encouraged repeatedly to answer questions. Pt with continued anxiety sx, as well as sleep issues - however seen as sleeping during day time . When this was discussed with pt - she reports lack of sleep at night could be contributing to her sleepiness during day time. Pt reports poor response to trazodone - discussed ambien - which she would like to try. She denied SI/AH/VH.    Principal Problem: MDD (major depressive disorder), recurrent episode, severe Diagnosis:   Patient Active Problem List   Diagnosis Date Noted  . MDD (major depressive disorder), recurrent episode, severe [F33.2] 09/25/2014  . H/O borderline personality disorder [Z86.59] 09/25/2014  . Opioid use disorder, moderate, dependence [F11.20] 09/25/2014  . 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 Cocaine use disorder, moderate, dependence [F14.20]    Total Time spent with patient: 25 minutes   Past Medical History:  Past Medical History  Diagnosis Date  . Endocarditis   . Osteomyelitis   . Sciatica     Family History: Pt unable to provide hx. Social History:  History  Alcohol Use No     History  Drug Use  . Yes  . Special: Heroin, Cocaine    Comment: heroin    Social History   Social History  . Marital Status: Single    Spouse Name: N/A  . Number of Children: N/A  . Years of Education: N/A   Social History Main Topics  . Smoking status: Current Every Day Smoker -- 1.00 packs/day  . Smokeless tobacco: None  .  Alcohol Use: No  . Drug Use: Yes    Special: Heroin, Cocaine     Comment: heroin  . Sexual Activity: Yes   Other Topics Concern  . None   Social History Narrative   Additional History:    Sleep: Poor  Appetite:  Poor    Musculoskeletal: Strength & Muscle Tone: within normal limits Gait & Station: normal Patient leans: normal   Psychiatric Specialty Exam: Physical Exam  Review of Systems  Constitutional: Negative for malaise/fatigue.  HENT: Negative.   Eyes: Negative.   Respiratory: Negative.   Cardiovascular: Negative.   Gastrointestinal: Negative.   Genitourinary: Negative.   Musculoskeletal: Positive for myalgias.  Skin: Negative.   Neurological: Negative for weakness.  Endo/Heme/Allergies: Negative.   Psychiatric/Behavioral: Positive for substance abuse. The patient is nervous/anxious and has insomnia.   All other systems reviewed and are negative.   Blood pressure 100/57, pulse 89, temperature 97.9 F (36.6 C), temperature source Oral, resp. rate 16, height  (1.6 m), weight 67.132 kg (148 lb), last menstrual period 09/21/2014.Body mass index is 26.22 kg/(m^2).  General Appearance: Fairly Groomed  Patent attorney::  Fair  Speech:  Clear and Coherent  Volume:  Decreased  Mood:  Anxious and Depressed  Affect:  Depressed  Thought Process:  Coherent and Goal Directed  Orientation:  Full (Time, Place, and Person)  Thought Content:  symptoms events worries concerns  Suicidal Thoughts:  No  Homicidal Thoughts:  No  Memory:  Immediate;   Fair Recent;   Fair Remote;   Fair  Judgement:  Fair  Insight:  Present  Psychomotor Activity:  Decreased  Concentration:  Fair  Recall:  [employer] of Knowledge:Fair  Language: Fair  Akathisia:  No  Handed:  Right  AIMS (if indicated):     Assets:  Desire for Improvement  ADL's:  Intact  Cognition: WNL  Sleep:  Number of Hours: 5.75     Current Medications: Current Facility-Administered Medications  Medication  Dose Route Frequency Provider Last Rate Last Dose  . acetaminophen (TYLENOL) tablet 650 mg  650 mg Oral Q6H PRN Worthy Flank, NP   650 mg at 09/28/14 0814  . alum & mag hydroxide-simeth (MAALOX/MYLANTA) 200-200-20 MG/5ML suspension 30 mL  30 mL Oral Q4H PRN Worthy Flank, NP      . busPIRone (BUSPAR) tablet 10 mg  10 mg Oral TID Rachael Fee, MD   10 mg at 09/28/14 0813  . citalopram (CELEXA) tablet 40 mg  40 mg Oral Daily Worthy Flank, NP   40 mg at 09/28/14 0814  . cloNIDine (CATAPRES) tablet 0.1 mg  0.1 mg Oral BH-qamhs Sanjuana Kava, NP   0.1 mg at 09/28/14 6295   Followed by  . [START ON 09/29/2014] cloNIDine (CATAPRES) tablet 0.1 mg  0.1 mg Oral QAC breakfast Sanjuana Kava, NP      . dicyclomine (BENTYL) tablet 20 mg  20 mg Oral Q6H PRN Sanjuana Kava, NP   20 mg at 09/28/14 0818  . gabapentin (NEURONTIN) capsule 900 mg  900 mg Oral TID Worthy Flank, NP   900 mg at 09/28/14 0814  . hydrOXYzine (ATARAX/VISTARIL) tablet 25 mg  25 mg Oral Q6H PRN Sanjuana Kava, NP   25 mg at 09/27/14 2235  . lidocaine (LIDODERM) 5 % 2 patch  2 patch Transdermal Q24H Rachael Fee, MD   2 patch at 09/27/14 2028  . loperamide (IMODIUM) capsule 2-4 mg  2-4 mg Oral PRN Sanjuana Kava, NP      . LORazepam (ATIVAN) tablet 1 mg  1 mg Oral Q6H PRN Rachael Fee, MD   1 mg at 09/27/14 2135  . magnesium hydroxide (MILK OF MAGNESIA) suspension 30 mL  30 mL Oral Daily PRN Worthy Flank, NP      . methocarbamol (ROBAXIN) tablet 500 mg  500 mg Oral Q8H PRN Sanjuana Kava, NP   500 mg at 09/28/14 0818  . naproxen (NAPROSYN) tablet 500 mg  500 mg Oral BID PRN Sanjuana Kava, NP   500 mg at 09/28/14 0649  . nicotine (NICODERM CQ - dosed in mg/24 hours) patch 21 mg  21 mg Transdermal Daily Worthy Flank, NP   21 mg at 09/28/14 0815  . ondansetron (ZOFRAN-ODT) disintegrating tablet 4 mg  4 mg Oral Q6H PRN Sanjuana Kava, NP   4 mg at 09/27/14 2137  . zolpidem (AMBIEN) tablet 5 mg  5 mg Oral QHS Jomarie Longs, MD         Lab Results: No results found for this or any previous visit (from the past 48 hour(s)).  Physical Findings: AIMS: Facial and Oral Movements Muscles of Facial Expression: None, normal Lips and Perioral Area: None, normal Jaw: None, normal Tongue: None, normal,Extremity Movements Upper (arms, wrists, hands, fingers): None, normal Lower (legs, knees, ankles, toes): None, normal, Trunk Movements Neck, shoulders, hips: None,  normal, Overall Severity Severity of abnormal movements (highest score from questions above): None, normal Incapacitation due to abnormal movements: None, normal Patient's awareness of abnormal movements (rate only patient's report): No Awareness, Dental Status Current problems with teeth and/or dentures?: Yes Does patient usually wear dentures?: No  CIWA:  CIWA-Ar Total: 1 COWS:  COWS Total Score: 3   Assessment: Pt seen today as sleepy , barely able to participate in evaluation process - reports sleep as restless at night. Will readjust medications.   Treatment Plan Summary: Daily contact with patient to assess and evaluate symptoms and progress in treatment and Medication management Supportive approach/coping skills Opioid dependence; continue Clonidine detox protocol  Work a relapse prevention plan Depression; continue to work with the Celexa up to 40 mg daily Anxiety; work with the Buspar 10 mg TID Anxiety/mood instability; continue to work with the Neurontin high dosages Insomnia: Start Ambien 5 mg po qhs . DC Trazodone. Explore residential treatment options Medical Decision Making:  Review of Psycho-Social Stressors (1), Review or order clinical lab tests (1), Established Problem, Worsening (2), Review of Last Therapy Session (1), Review of Medication Regimen & Side Effects (2) and Review of New Medication or Change in Dosage (2)     Larenda Reedy md 09/28/2014, 1:20 PM

## 2014-09-28 NOTE — [facility] Group Notes (Signed)
The focus of this group is to educate the patient on the purpose and policies of crisis stabilization and provide a format to answer questions about their admission.  The group details unit policies and expectations of patients while admitted.  Patient did not attend 0900 nurse education orientation group this morning.  Patient stayed in bed.   

## 2014-09-29 MED ORDER — BENZOCAINE 10 % MT GEL: Freq: Four times a day (QID) | OROMUCOSAL | Status: DC | PRN

## 2014-09-29 MED ORDER — NAPROXEN 500 MG PO TABS
500.0000 mg | ORAL_TABLET | Freq: Three times a day (TID) | ORAL | Status: DC
  Administered 2014-09-29 – 2014-10-02 (×9): 500 mg via ORAL
  Filled 2014-09-29 (×17): qty 1

## 2014-09-29 MED ORDER — CLINDAMYCIN HCL 300 MG PO CAPS
300.0000 mg | ORAL_CAPSULE | Freq: Three times a day (TID) | ORAL | Status: DC
Start: 2014-09-29 — End: 2014-10-02
  Administered 2014-09-29 – 2014-10-02 (×9): 300 mg via ORAL
  Filled 2014-09-29 (×14): qty 1

## 2014-09-29 MED ORDER — ZOLPIDEM TARTRATE 10 MG PO TABS
10.0000 mg | ORAL_TABLET | Freq: Every day | ORAL | Status: DC
  Administered 2014-09-29 – 2014-10-01 (×3): 10 mg via ORAL
  Filled 2014-09-29 (×3): qty 1

## 2014-09-29 MED ORDER — CLINDAMYCIN HCL 300 MG PO CAPS
300.0000 mg | ORAL_CAPSULE | Freq: Three times a day (TID) | ORAL | Status: DC
  Administered 2014-09-29: 300 mg via ORAL
  Filled 2014-09-29: qty 1
  Filled 2014-09-29: qty 2
  Filled 2014-09-29 (×2): qty 1

## 2014-09-29 MED ORDER — BUSPIRONE HCL 15 MG PO TABS
15.0000 mg | ORAL_TABLET | Freq: Three times a day (TID) | ORAL | Status: DC
  Administered 2014-09-29 – 2014-10-02 (×9): 15 mg via ORAL
  Filled 2014-09-29: qty 21
  Filled 2014-09-29 (×8): qty 1
  Filled 2014-09-29: qty 21
  Filled 2014-09-29 (×2): qty 1
  Filled 2014-09-29: qty 21
  Filled 2014-09-29: qty 1

## 2014-09-29 NOTE — Progress Notes (Signed)
D: Pt actively calling rehab facilities for d/c.

## 2014-09-29 NOTE — Progress Notes (Signed)
[facility] MD Progress Note  09/29/2014 12:03 PM Brenda Stafford  MRN:  [id number] Subjective:  [name] states " I am in pain , I have tooth ache.  I slept a little better , but then I woke up , I think the dose needs to be increased."  Objective:Brenda Stafford is a 29 year old Caucasian female. Admitted to [facility] from the [facility] with complaints of opioid addiction, requesting detoxification treatments. Pt seen today and I have reviewed Dr.Lugo's previous notes in EHR .  Pt appears to be in pain , reports that she has tooth ache . Pt today is more cooperative than yesterday , is more alert , less drowsy. Pt also with sleep issues - her medications were changed yesterday - she continues to have a restless night. Pt with continued SI - reports she is able to cope with it better than before . Pt per nursing continues to be very somatic , medication seeking and appears to be having worsening mood sx. Will continue to support and treat.    Principal Problem: MDD (major depressive disorder), recurrent episode, severe Diagnosis:   Patient Active Problem List   Diagnosis Date Noted  . MDD (major depressive disorder), recurrent episode, severe [F33.2] 09/25/2014  . H/O borderline personality disorder [Z86.59] 09/25/2014  . Opioid use disorder, moderate, dependence [F11.20] 09/25/2014  . 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 Cocaine use disorder, moderate, dependence [F14.20]    Total Time spent with patient: 25 minutes   Past Medical History:  Past Medical History  Diagnosis Date  . Endocarditis   . Osteomyelitis   . Sciatica     Family History: Per review of previous notes - mental illness and substance abuse runs in her family. Social History:  History  Alcohol Use No     History  Drug Use  . Yes  . Special: Heroin, Cocaine    Comment: heroin    Social History   Social History  . Marital Status: Single    Spouse Name: N/A  . Number of Children: N/A  . Years of Education: N/A   Social History  Main Topics  . Smoking status: Current Every Day Smoker -- 1.00 packs/day  . Smokeless tobacco: None  . Alcohol Use: No  . Drug Use: Yes    Special: Heroin, Cocaine     Comment: heroin  . Sexual Activity: Yes   Other Topics Concern  . None   Social History Narrative   Additional History:    Sleep: Poor  Appetite:  Poor    Musculoskeletal: Strength & Muscle Tone: within normal limits Gait & Station: normal Patient leans: normal   Psychiatric Specialty Exam: Physical Exam  Review of Systems  Constitutional: Negative for malaise/fatigue.  HENT:       Tooth ache  Eyes: Negative.   Respiratory: Negative.   Cardiovascular: Negative.   Gastrointestinal: Negative.   Genitourinary: Negative.   Musculoskeletal: Positive for myalgias.  Skin: Negative.   Neurological: Positive for headaches. Negative for weakness.  Endo/Heme/Allergies: Negative.   Psychiatric/Behavioral: Positive for substance abuse. The patient is nervous/anxious and has insomnia.   All other systems reviewed and are negative.   Blood pressure 90/55, pulse 75, temperature 97.7 F (36.5 C), temperature source Oral, resp. rate 20, height 5\' 3"  (1.6 m), weight 67.132 kg (148 lb), last menstrual period 09/21/2014.Body mass index is 26.22 kg/(m^2).  General Appearance: Fairly Groomed  Patent attorney::  Fair  Speech:  Clear and Coherent  Volume:  Decreased  Mood:  Anxious and Depressed  Affect:  Depressed  Thought Process:  Coherent and Goal Directed  Orientation:  Full (Time, Place, and Person)  Thought Content:  symptoms events worries concerns  Suicidal Thoughts:  No  Homicidal Thoughts:  No  Memory:  Immediate;   Fair Recent;   Fair Remote;   Fair  Judgement:  Fair  Insight:  Present  Psychomotor Activity:  Decreased  Concentration:  Fair  Recall:  [employer] of Knowledge:Fair  Language: Fair  Akathisia:  No  Handed:  Right  AIMS (if indicated):     Assets:  Desire for Improvement  ADL's:   Intact  Cognition: WNL  Sleep:  Number of Hours: 5.75     Current Medications: Current Facility-Administered Medications  Medication Dose Route Frequency Provider Last Rate Last Dose  . acetaminophen (TYLENOL) tablet 650 mg  650 mg Oral Q6H PRN Worthy Flank, NP   650 mg at 09/28/14 2312  . alum & mag hydroxide-simeth (MAALOX/MYLANTA) 200-200-20 MG/5ML suspension 30 mL  30 mL Oral Q4H PRN Worthy Flank, NP   30 mL at 09/28/14 2340  . benzocaine (ORAJEL) 10 % mucosal gel   Mouth/Throat QID PRN Jomarie Longs, MD      . busPIRone (BUSPAR) tablet 15 mg  15 mg Oral TID Jomarie Longs, MD      . citalopram (CELEXA) tablet 40 mg  40 mg Oral Daily Worthy Flank, NP   40 mg at 09/29/14 0930  . clindamycin (CLEOCIN) capsule 300 mg  300 mg Oral 3 times per day Jomarie Longs, MD      . cloNIDine (CATAPRES) tablet 0.1 mg  0.1 mg Oral QAC breakfast Sanjuana Kava, NP   0.1 mg at 09/29/14 0928  . dicyclomine (BENTYL) tablet 20 mg  20 mg Oral Q6H PRN Sanjuana Kava, NP   20 mg at 09/29/14 0936  . gabapentin (NEURONTIN) capsule 900 mg  900 mg Oral TID Worthy Flank, NP   900 mg at 09/29/14 0930  . hydrOXYzine (ATARAX/VISTARIL) tablet 25 mg  25 mg Oral Q6H PRN Sanjuana Kava, NP   25 mg at 09/28/14 2008  . lidocaine (LIDODERM) 5 % 2 patch  2 patch Transdermal Q24H Rachael Fee, MD   2 patch at 09/28/14 1954  . loperamide (IMODIUM) capsule 2-4 mg  2-4 mg Oral PRN Sanjuana Kava, NP      . LORazepam (ATIVAN) tablet 1 mg  1 mg Oral Q6H PRN Rachael Fee, MD   1 mg at 09/28/14 1804  . magnesium hydroxide (MILK OF MAGNESIA) suspension 30 mL  30 mL Oral Daily PRN Worthy Flank, NP   30 mL at 09/28/14 1804  . methocarbamol (ROBAXIN) tablet 500 mg  500 mg Oral Q8H PRN Sanjuana Kava, NP   500 mg at 09/29/14 0936  . naproxen (NAPROSYN) tablet 500 mg  500 mg Oral TID WC Corabelle Spackman, MD      . nicotine (NICODERM CQ - dosed in mg/24 hours) patch 21 mg  21 mg Transdermal Daily Worthy Flank, NP   21 mg at  09/29/14 0934  . ondansetron (ZOFRAN-ODT) disintegrating tablet 4 mg  4 mg Oral Q6H PRN Sanjuana Kava, NP   4 mg at 09/27/14 2137  . zolpidem (AMBIEN) tablet 10 mg  10 mg Oral QHS Jomarie Longs, MD        Lab Results:  Results for orders placed or performed during the hospital encounter  of 09/25/14 (from the past 48 hour(s))  Pregnancy, urine     Status: None   Collection Time: 09/28/14  8:08 PM  Result Value Ref Range   Preg Test, Ur NEGATIVE NEGATIVE    Comment:        THE SENSITIVITY OF THIS METHODOLOGY IS >20 mIU/mL. Performed at [facility]     Physical Findings: AIMS: Facial and Oral Movements Muscles of Facial Expression: None, normal Lips and Perioral Area: None, normal Jaw: None, normal Tongue: None, normal,Extremity Movements Upper (arms, wrists, hands, fingers): None, normal Lower (legs, knees, ankles, toes): None, normal, Trunk Movements Neck, shoulders, hips: None, normal, Overall Severity Severity of abnormal movements (highest score from questions above): None, normal Incapacitation due to abnormal movements: None, normal Patient's awareness of abnormal movements (rate only patient's report): No Awareness, Dental Status Current problems with teeth and/or dentures?: Yes Does patient usually wear dentures?: No  CIWA:  CIWA-Ar Total: 1 COWS:  COWS Total Score: 2   Assessment: Pt seen today appears to be in pain from tooth issues , continues to have anxiety sx, sleep issues as well as SI . Will readjust medications.   Treatment Plan Summary: Daily contact with patient to assess and evaluate symptoms and progress in treatment and Medication management Supportive approach/coping skills Opioid dependence; continue Clonidine detox protocol  Work a relapse prevention plan Depression; continue to work with the Celexa up to 40 mg daily Anxiety; Increase Buspar to 15 mg TID Anxiety/mood instability; continue to work with the Neurontin high  dosages Insomnia:Increase  Ambien to 10 mg po qhs .  Tooth ache - Start Clindamycin 300 mg po tid for 7 days , orajel topical, .naproxen 500 mg po tid for pain. Explore residential treatment options Medical Decision Making:  Review of Psycho-Social Stressors (1), Review or order clinical lab tests (1), Established Problem, Worsening (2), Review of Last Therapy Session (1), Review of Medication Regimen & Side Effects (2) and Review of New Medication or Change in Dosage (2)     Cruze Zingaro md 09/29/2014, 12:03 PM

## 2014-09-29 NOTE — Tx Team (Signed)
Interdisciplinary Treatment Plan Update (Adult) Date: 09/26/14   Time Reviewed: 9:30 AM  Progress in Treatment: Attending groups: No Participating in groups: No Taking medication as prescribed: Yes Tolerating medication: Yes Family/Significant other contact made: No, CSW assessing for appropriate contacts Patient understands diagnosis: Yes Discussing patient identified problems/goals with staff: Yes Medical problems stabilized or resolved: Yes Denies suicidal/homicidal ideation: Yes Issues/concerns per patient self-inventory: Yes Other:  New problem(s) identified: Pt has no been attending groups.   Discharge Plan or Barriers: Patient requesting referral to residential treatment. Patient currently homeless with limited supports and no income. She has not been attending group or discharge planning. Medicaid is out of state.     Reason for Continuation of Hospitalization:  Depression Anxiety Medication Stabilization   Estimated length of stay: 3-4 days    Comments:  Brenda Stafford is a 29 year old Caucasian female. Admitted to [facility] from the [facility] with complaints of opioid addiction, requesting detoxification treatments. During this assessment, Brenda Stafford reports, "It was 2 days ago when I walked to the [facility] ED. I had an abcess tooth. While at the ED, I told them that I needed to detox from Heroin. I have used Heroin by injection everyday x 2 years. I was also using any other drugs I could get my hands on. This is my first time being inpatient for my addiction. I have borderline personality disorder & anxiety disorder as well. I was diagnosed with bipolar disorder about 2 years ago. I was put on gabapentin, Celexa & Trazodone. I had overdosed twice in my life time; one overdose was intentional, the other, accidental. I'm also a cutter. I cut to deal & cope with my depression if I was not using drugs. My longest sobriety is about 2 years & that was from 2010 to 2012.  After my discharge from this hospital, I will be going back to [place]. I plan on going back to the [place] for further treatment. Mental illness & substance abuse runs in my family. I don't sleep well at night".  Review of initial/current patient goals per problem list:  1. Goal(s): Patient will participate in aftercare plan   Met: No   Target date: 3-5 days post admission date   As evidenced by: Patient will participate within aftercare plan AEB aftercare provider and housing plan at discharge being identified.  9/6: Goal not met: CSW assessing for appropriate referrals for pt and will have follow up secured prior to d/c.   09/29/2014 goal not met. Pt has not been participating or attending groups and did not attend d/c planning group. CSW continuing to assess for referrals. Out of state medicaid.    2. Goal (s): Patient will exhibit decreased depressive symptoms and suicidal ideations.   Met: No   Target date: 3-5 days post admission date   As evidenced by: Patient will utilize self rating of depression at 3 or below and demonstrate decreased signs of depression or be deemed stable for discharge by MD.  9/6: Goal not met: Pt presents with flat affect and depressed mood.  Pt admitted with depression rating of 10.  Pt to show decreased sign of depression and a rating of 3 or less before d/c.     Goal not met. Pt rates depression as high and presents with depressed mood and flat/lethargic affect. Denies SI/HI/AVH.   3. Goal(s): Patient will demonstrate decreased signs and symptoms of anxiety.   Met: No   Target date:  3-5 days post admission date   As evidenced by: Patient will utilize self rating of anxiety at 3 or below and demonstrated decreased signs of anxiety, or be deemed stable for discharge by MD  9/6: Goal not met: Pt presents with anxious mood and affect.  Pt admitted with anxiety rating of 10.  Pt to show decreased sign of anxiety and a rating of  3 or less before d/c.   09/29/2014 Goal not met. Pt continues to rate anxiety as high. Presents with depressed mood/flat affect.   4. Goal(s): Patient will demonstrate decreased signs of withdrawal due to substance abuse   Met: No   Target date: 3-5 days post admission date  9/6: Patient with COWS score of 5 today, experiencing anxiety, tremor and GI upset.   09/29/2014 Pt reports mild withdrawals with COWS of 2 and low standing/sitting BP.   Attendees: Patient:    Family:    Physician: Dr. Parke Poisson; 09/29/2014 10:38 AM   Nursing: Clarise Cruz RN; [facility] RN 09/29/2014 10:38 AM   Clinical Social Worker: Tilden Fossa, [place] 09/29/2014 10:38 AM   Other:Kaidan Harpster Smart LCSWA 09/29/2014 10:38 AM   Other: Lucinda Dell, [place] Sessions Liaison 09/29/2014 10:38 AM   Other: Lars Pinks, Case Manager 09/29/2014 10:38 AM   Other: Ave Filter, NP 09/29/2014 10:38 AM   Other:    Other:    Other:         Scribe for Treatment Team:  Maxie Better, [place] Worker 09/29/2014 10:38 AM

## 2014-09-29 NOTE — [facility] Group Notes (Signed)
[facility] LCSW Group Therapy  09/29/2014 12:47 PM  Type of Therapy:  Group Therapy  Participation Level:  Active  Participation Quality:  Attentive  Affect:  Appropriate  Cognitive:  Alert and Oriented  Insight:  Engaged  Engagement in Therapy:  Engaged  Modes of Intervention:  Confrontation, Discussion, Education, Exploration, Problem-solving, Rapport Building, Socialization and Support  Summary of Progress/Problems: Feelings around Relapse. Group members discussed the meaning of relapse and shared personal stories of relapse, how it affected them and others, and how they perceived themselves during this time. Group members were encouraged to identify triggers, warning signs and coping skills used when facing the possibility of relapse. Social supports were discussed and explored in detail. Post Acute Withdrawal Syndrome (handout provided) was introduced and examined. Pt's were encouraged to ask questions, talk about key points associated with PAWS, and process this information in terms of relapse prevention. Blia was attentive and engaged during today's processing group. 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 "my family doesn't understand and I plan to share this information with them. "Carita acknowledged the importance of putting herself first and practicing self care.   Smart, Cynthya Yam LCSWA  09/29/2014, 12:47 PM

## 2014-09-29 NOTE — [facility] Group Notes (Signed)
[facility] LCSW Aftercare Discharge Planning Group Note   09/29/2014 10:03 AM  Participation Quality:  Invited-DID NOT ATTEND. Pt chose to remain in bed.   Smart, [employer]

## 2014-09-29 NOTE — Progress Notes (Signed)
Patient ID: Brenda Stafford, female   DOB: [birth year], 29 y.o.   MRN: [id number]  Pt currently presents with a flat affect and anxious behavior. Pt remains in bed this morning. Pt states "my tooth hurts so bad." Pt encouraged to go to lunch and after a 1:1 pt does attend.Pt also complains of stomach and back pain. Pt seen interacting and smiling with pts in the dayroom this afternoon.  Pt provided with medications per providers orders. Pt's labs and vitals were monitored throughout the day. Pt supported emotionally and encouraged to express concerns and questions. Pt educated on medications. Pt's safety ensured with 15 minute and environmental checks. Pt currently denies SI/HI and A/V hallucinations. Pt verbally agrees to seek staff if SI/HI or A/VH occurs and to consult with staff before acting on these thoughts. Pt has been effectively using coloring pages as a coping skill for anxiety. Will continue POC.

## 2014-09-30 MED ORDER — IBUPROFEN 800 MG PO TABS
800.0000 mg | ORAL_TABLET | Freq: Four times a day (QID) | ORAL | Status: DC | PRN
  Administered 2014-09-30 – 2014-10-01 (×4): 800 mg via ORAL
  Filled 2014-09-30 (×4): qty 1

## 2014-09-30 NOTE — Progress Notes (Signed)
D: Pt presents anxious in affect and pleasant in mood. Pt was visible and active within the milieu. Pt was not as focused on medications as she was during the previous two evening. Pt denied any SI/HI/AVH. Pt is hoping to be discharged on Saturday morning to go to a rehab facility in [place]. Pt is compliant with her current POC. A: Writer administered scheduled and prn medications to pt, per MD orders. Continued support and availability as needed was extended to this pt. Staff continue to monitor pt with q31min checks.  R: No adverse drug reactions noted. Pt receptive to treatment. Pt remains safe at this time.

## 2014-09-30 NOTE — Progress Notes (Addendum)
Patient ID: Brenda Stafford, female   DOB: [birth year], 29 y.o.   MRN: [id number] [facility] MD Progress Note  09/30/2014 6:05 PM Brenda Stafford  MRN:  [id number]  Subjective:  Brenda Stafford states "I feel a lot better. I feel a lot happier now. I came in voluntarily, can I get discharged now?  Objective: Pt seen today. She is alert. She says she is doing better. However, threatening to leave AMA if she could not get Toradol injection. Brenda Stafford later says she will just stay until discharged.  Principal Problem: MDD (major depressive disorder), recurrent episode, severe  Diagnosis:   Patient Active Problem List   Diagnosis Date Noted  . MDD (major depressive disorder), recurrent episode, severe [F33.2] 09/25/2014  . H/O borderline personality disorder [Z86.59] 09/25/2014  . Opioid use disorder, moderate, dependence [F11.20] 09/25/2014  . 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 Cocaine use disorder, moderate, dependence [F14.20]    Total Time spent with patient: 15 minutes  Past Medical History:  Past Medical History  Diagnosis Date  . Endocarditis   . Osteomyelitis   . Sciatica     Family History: Per review of previous notes - mental illness and substance abuse runs in her family.  Social History:  History  Alcohol Use No     History  Drug Use  . Yes  . Special: Heroin, Cocaine    Comment: heroin    Social History   Social History  . Marital Status: Single    Spouse Name: N/A  . Number of Children: N/A  . Years of Education: N/A   Social History Main Topics  . Smoking status: Current Every Day Smoker -- 1.00 packs/day  . Smokeless tobacco: None  . Alcohol Use: No  . Drug Use: Yes    Special: Heroin, Cocaine     Comment: heroin  . Sexual Activity: Yes   Other Topics Concern  . None   Social History Narrative   Additional History:    Sleep: Good  Appetite:  Good  Musculoskeletal: Strength & Muscle Tone: within normal limits Gait & Station: normal Patient leans: normal  Psychiatric  Specialty Exam: Physical Exam  Review of Systems  Constitutional: Negative for malaise/fatigue.  HENT:       Tooth ache  Eyes: Negative.   Respiratory: Negative.   Cardiovascular: Negative.   Gastrointestinal: Negative.   Genitourinary: Negative.   Musculoskeletal: Positive for myalgias.  Skin: Negative.   Neurological: Positive for headaches. Negative for weakness.  Endo/Heme/Allergies: Negative.   Psychiatric/Behavioral: Positive for substance abuse. The patient is nervous/anxious and has insomnia.   All other systems reviewed and are negative.   Blood pressure 121/62, pulse 96, temperature 98 F (36.7 C), temperature source Oral, resp. rate 16, height 5\' 3"  (1.6 m), weight 67.132 kg (148 lb), last menstrual period 09/21/2014.Body mass index is 26.22 kg/(m^2).  General Appearance: Fairly Groomed  Patent attorney::  Fair  Speech:  Clear and Coherent  Volume:  Decreased  Mood:  Anxious and Depressed  Affect:  Depressed  Thought Process:  Coherent and Goal Directed  Orientation:  Full (Time, Place, and Person)  Thought Content:  symptoms events worries concerns  Suicidal Thoughts:  No  Homicidal Thoughts:  No  Memory:  Immediate;   Fair Recent;   Fair Remote;   Fair  Judgement:  Fair  Insight:  Present  Psychomotor Activity:  Normal  Concentration:  Fair  Recall:  [employer] of Knowledge:Fair  Language: Fair  Akathisia:  No  Handed:  Right  AIMS (if indicated):     Assets:  Desire for Improvement  ADL's:  Intact  Cognition: WNL  Sleep:  Number of Hours: 5.25   Current Medications: Current Facility-Administered Medications  Medication Dose Route Frequency Provider Last Rate Last Dose  . acetaminophen (TYLENOL) tablet 650 mg  650 mg Oral Q6H PRN Worthy Flank, NP   650 mg at 09/29/14 2119  . alum & mag hydroxide-simeth (MAALOX/MYLANTA) 200-200-20 MG/5ML suspension 30 mL  30 mL Oral Q4H PRN Worthy Flank, NP   30 mL at 09/28/14 2340  . benzocaine (ORAJEL) 10 %  mucosal gel   Mouth/Throat QID PRN Jomarie Longs, MD      . busPIRone (BUSPAR) tablet 15 mg  15 mg Oral TID Jomarie Longs, MD   15 mg at 09/30/14 1631  . citalopram (CELEXA) tablet 40 mg  40 mg Oral Daily Worthy Flank, NP   40 mg at 09/30/14 0806  . clindamycin (CLEOCIN) capsule 300 mg  300 mg Oral 3 times per day Jomarie Longs, MD   300 mg at 09/30/14 1254  . gabapentin (NEURONTIN) capsule 900 mg  900 mg Oral TID Worthy Flank, NP   900 mg at 09/30/14 1631  . ibuprofen (ADVIL,MOTRIN) tablet 800 mg  800 mg Oral Q6H PRN Sanjuana Kava, NP   800 mg at 09/30/14 1631  . lidocaine (LIDODERM) 5 % 2 patch  2 patch Transdermal Q24H Rachael Fee, MD   2 patch at 09/29/14 2006  . LORazepam (ATIVAN) tablet 1 mg  1 mg Oral Q6H PRN Rachael Fee, MD   1 mg at 09/30/14 1255  . magnesium hydroxide (MILK OF MAGNESIA) suspension 30 mL  30 mL Oral Daily PRN Worthy Flank, NP   30 mL at 09/28/14 1804  . naproxen (NAPROSYN) tablet 500 mg  500 mg Oral TID WC Saramma Eappen, MD   500 mg at 09/30/14 1254  . nicotine (NICODERM CQ - dosed in mg/24 hours) patch 21 mg  21 mg Transdermal Daily Worthy Flank, NP   21 mg at 09/30/14 0810  . zolpidem (AMBIEN) tablet 10 mg  10 mg Oral QHS Jomarie Longs, MD   10 mg at 09/29/14 2119   Lab Results:  Results for orders placed or performed during the hospital encounter of 09/25/14 (from the past 48 hour(s))  Pregnancy, urine     Status: None   Collection Time: 09/28/14  8:08 PM  Result Value Ref Range   Preg Test, Ur NEGATIVE NEGATIVE    Comment:        THE SENSITIVITY OF THIS METHODOLOGY IS >20 mIU/mL. Performed at [facility]     Physical Findings: AIMS: Facial and Oral Movements Muscles of Facial Expression: None, normal Lips and Perioral Area: None, normal Jaw: None, normal Tongue: None, normal,Extremity Movements Upper (arms, wrists, hands, fingers): None, normal Lower (legs, knees, ankles, toes): None, normal, Trunk Movements Neck,  shoulders, hips: None, normal, Overall Severity Severity of abnormal movements (highest score from questions above): None, normal Incapacitation due to abnormal movements: None, normal Patient's awareness of abnormal movements (rate only patient's report): No Awareness, Dental Status Current problems with teeth and/or dentures?: Yes Does patient usually wear dentures?: No  CIWA:  CIWA-Ar Total: 1 COWS:  COWS Total Score: 0  Treatment Plan Summary: Daily contact with patient to assess and evaluate symptoms and progress in treatment and Medication management Supportive approach/coping skills Opioid dependence; completed detox protocols. Work  on a relapse prevention plan Depression; continue to work with the Celexa up to 40 mg daily Anxiety; continue Buspar to 15 mg TID Anxiety/mood instability; continue to work with the Neurontin high dosages Insomnia: Continue Ambien to 10 mg po qhs .  Tooth ache - continue Clindamycin 300 mg po tid for 7 days , orajel topical, .naproxen 500 mg po tid for pain. Explore residential treatment options  Medical Decision Making:  Review of Psycho-Social Stressors (1), Review or order clinical lab tests (1), Established Problem, Worsening (2), Review of Last Therapy Session (1), Review of Medication Regimen & Side Effects (2) and Review of New Medication or Change in Dosage (2)  Sanjuana Kava, PMHNP, FNP-BC 09/30/2014, 6:05 PM  Reviewed the information documented and agree with the treatment plan.  Ezechiel Stooksbury,JANARDHAHA R. 09/30/2014 6:17 PM

## 2014-09-30 NOTE — Progress Notes (Signed)
Patient repeatedly asked for toradol for pain.  Informed patient she was given ibuprofen which was ordered per NP.  Patient has ibuprofen and naproxen for pain.  She became upset when I informed her NP would not order the toradol.  She wanted to leave AMA.  Informed NP and was told she could leave AMA.  Patient became upset when she was told she could leave AMA.  She became tearful and stated, "I need to stay.  If I leave, I know that I'm going to go out and use."  Patient spoke with her family on the phone and was observed sobbing.  She came back to staff and stated she felt better and needed to stay.

## 2014-09-30 NOTE — [facility] Group Notes (Signed)
[facility] Group Notes:  (Nursing/MHT/Case Management/Adjunct)  Date:  09/30/2014  Time: 0900 am  Type of Therapy:  Psychoeducational Skills  Participation Level:  Did Not Attend  Patient invited; elected to remain in bed.  Cranford Mon 09/30/2014, 3:56 PM

## 2014-09-30 NOTE — Progress Notes (Signed)
Patient ID: Brenda Stafford, female   DOB: 11-01-1985, 29 y.o.   MRN: [id number] D: Patient in dayroom on approach. Pt asking for Toradol because a couple of pt on the hall are taking the same medication. Pt is needy asking for all the medication she can have at once. Pt denies SI/HI/AVH.  Cooperative with assessment. No acute distressed noted at this time.  A: Met with pt 1:1. Medications administered as prescribed. Support and encouragement provided to take program seriously. Pt encouraged to discuss feelings and come to staff with any question or concerns.  R: Patient remains safe and complaint with medications.

## 2014-09-30 NOTE — [facility] Group Notes (Signed)
[facility] Group Notes: (Clinical Social Work)   09/30/2014      Type of Therapy:  Group Therapy   Participation Level:  Did Not Attend despite MHT prompting   Ambrose Mantle, LCSW 09/30/2014, 11:59 AM

## 2014-09-30 NOTE — Progress Notes (Addendum)
D: Patient has been in bed this morning, only getting up to get her medications.  She states, "I usually sleep until 1000.  I don't like being up this early."  Patient also refused to talk to a nursing student stating, "I'm going to bed."  She denies SI/HI/AVH.  Patient has minimal withdrawal symptoms.  She continues on the clonidine protocol.  A: Continue to monitor medication management and MD orders.  Safety checks completed every 15 minutes per protocol.  Offer support and encouragement as needed. R: Patient's behavior is appropriate to situation.  Update:  Patient stated on her self inventory that she is "leaving."  Her goal is to "not show out because they won't let me go."  Patient is requesting a muscle relaxer.

## 2014-10-01 DIAGNOSIS — F332 Major depressive disorder, recurrent severe without psychotic features: Secondary | ICD-10-CM | POA: Insufficient documentation

## 2014-10-01 MED ORDER — QUETIAPINE FUMARATE 50 MG PO TABS
50.0000 mg | ORAL_TABLET | Freq: Once | ORAL | Status: AC
  Administered 2014-10-02: 50 mg via ORAL

## 2014-10-01 NOTE — Progress Notes (Signed)
Pt reports she has been having severe anxiety today.  She initially was observed in the dayroom sitting close to a female peer talking and laughing.  When Clinical research associate approached her, Clinical research associate introduced self as Erskine Squibb and informed her about applying her lidocaine patches. Pt jumped up and followed writer to her room.  Going to her room, she was saying in a singsong fashion, "MaryJane, MaryJane, my nurse is MaryJane", so other patients could hear her.  When pt and writer got to her room, she then began to cry, saying that she missed her kids and that she wanted to see them.  She said that she was supposed to be discharged tomorrow because she signed a form.  She said she wanted to get out of the hospital and get "her meds".  Pt does not seem vested in staying sober.  Another pt reported that pt has been talking about what she is going to do when she is discharged, meaning that she is planning on using.  Pt has been very med focused and wanting to know what meds she can get and when she can get them.  She was given Ativan 1 mg prn and her Ambien 10 mg at bedtime.  She was told that she needed to go to bed after taking the Ambien, but staff saw her go to the dayroom and spend about 30-40 minutes in the dayroom before going to bed.  At this time, pt is angry that she is still awake and wants more medication for sleep.  Will notify on call provider to see if any more orders will be given.  Pt denies SI/HI/AVH.  Support and encouragement offered.  Safety maintained with q15 minute checks.

## 2014-10-01 NOTE — Progress Notes (Signed)
Patient did attend the evening speaker AA meeting.  

## 2014-10-01 NOTE — [facility] Group Notes (Signed)
[facility] Group Notes: (Clinical Social Work)   10/01/2014      Type of Therapy:  Group Therapy   Participation Level:  Did Not Attend despite MHT prompting   Ambrose Mantle, LCSW 10/01/2014, 12:38 PM

## 2014-10-01 NOTE — Progress Notes (Signed)
D:Per patient self inventory form pt reports she slept good last night with the use of sleep medication. She reports a good appetite, normal energy level, poor concentration. She rates depression 5/10, hopelessness 4/10, anxiety 12/10- all on 0-10 scale, 10 being the worse. Pt reports chronic back pain 8/10. She denies SI/HI. Denies AVH. Pt reports her goal for the day is "seeing my children" and "stay on the phone with family!" will help her meet goal. Pt childlike on approach. Laughing inappropriately. Not attending nursing group on the unit.  A:Special checks q 15 mins in place for safety. Medication administered per MD order(see eMAR). COWS protocol in place. Encouragement and support provided.  R:Compliant with medication regimen. Safety maintained. Will continue to monitor.

## 2014-10-01 NOTE — Plan of Care (Signed)
Problem: Alteration in mood; excessive anxiety as evidenced by: Goal: STG-Pt can identify coping skills to manage panic/anxiety (Patient can identify at least ____ coping skills to manage panic/anxiety attack)  Outcome: Not Progressing Pt demanding to leave the hospital when she does not get her way

## 2014-10-01 NOTE — Progress Notes (Signed)
Patient ID: Brenda Stafford, female   DOB: 08-20-1985, 29 y.o.   MRN: [id number] Patient ID: Brenda Stafford, female   DOB: Jan 31, 1985, 29 y.o.   MRN: [id number] [facility] MD Progress Note  10/01/2014 3:20 PM Brenda Stafford  MRN:  [id number]  Subjective:  Brenda Stafford states "I doing well" She denies any new issues or concerns.  Objective: Pt seen today. She is alert. She says she is doing better. She sleeps most of the day & comes out in the evenings. She is in no apparent distress.  Principal Problem: MDD (major depressive disorder), recurrent episode, severe  Diagnosis:   Patient Active Problem List   Diagnosis Date Noted  . MDD (major depressive disorder), recurrent episode, severe [F33.2] 09/25/2014  . H/O borderline personality disorder [Z86.59] 09/25/2014  . Opioid use disorder, moderate, dependence [F11.20] 09/25/2014  . 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 Cocaine use disorder, moderate, dependence [F14.20]    Total Time spent with patient: 15 minutes  Past Medical History:  Past Medical History  Diagnosis Date  . Endocarditis   . Osteomyelitis   . Sciatica     Family History: Per review of previous notes - mental illness and substance abuse runs in her family.  Social History:  History  Alcohol Use No     History  Drug Use  . Yes  . Special: Heroin, Cocaine    Comment: heroin    Social History   Social History  . Marital Status: Single    Spouse Name: N/A  . Number of Children: N/A  . Years of Education: N/A   Social History Main Topics  . Smoking status: Current Every Day Smoker -- 1.00 packs/day  . Smokeless tobacco: None  . Alcohol Use: No  . Drug Use: Yes    Special: Heroin, Cocaine     Comment: heroin  . Sexual Activity: Yes   Other Topics Concern  . None   Social History Narrative   Additional History:    Sleep: Good  Appetite:  Good  Musculoskeletal: Strength & Muscle Tone: within normal limits Gait & Station: normal Patient leans: normal  Psychiatric  Specialty Exam: Physical Exam  Review of Systems  Constitutional: Negative for malaise/fatigue.  HENT:       Tooth ache  Eyes: Negative.   Respiratory: Negative.   Cardiovascular: Negative.   Gastrointestinal: Negative.   Genitourinary: Negative.   Musculoskeletal: Positive for myalgias.  Skin: Negative.   Neurological: Positive for headaches. Negative for weakness.  Endo/Heme/Allergies: Negative.   Psychiatric/Behavioral: Positive for substance abuse. The patient is nervous/anxious and has insomnia.   All other systems reviewed and are negative.   Blood pressure 111/73, pulse 82, temperature 97.8 F (36.6 C), temperature source Oral, resp. rate 16, height  (1.6 m), weight 67.132 kg (148 lb), last menstrual period 09/21/2014.Body mass index is 26.22 kg/(m^2).  General Appearance: Fairly Groomed  Patent attorney::  Fair  Speech:  Clear and Coherent  Volume:  Decreased  Mood:  Anxious and Depressed  Affect:  Depressed  Thought Process:  Coherent and Goal Directed  Orientation:  Full (Time, Place, and Person)  Thought Content:  symptoms events worries concerns  Suicidal Thoughts:  No  Homicidal Thoughts:  No  Memory:  Immediate;   Fair Recent;   Fair Remote;   Fair  Judgement:  Fair  Insight:  Present  Psychomotor Activity:  Normal  Concentration:  Fair  Recall:  [employer] of Knowledge:Fair  Language: Fair  Akathisia:  No  Handed:  Right  AIMS (if indicated):     Assets:  Desire for Improvement  ADL's:  Intact  Cognition: WNL  Sleep:  Number of Hours: 3.25   Current Medications: Current Facility-Administered Medications  Medication Dose Route Frequency Provider Last Rate Last Dose  . acetaminophen (TYLENOL) tablet 650 mg  650 mg Oral Q6H PRN Worthy Flank, NP   650 mg at 09/29/14 2119  . alum & mag hydroxide-simeth (MAALOX/MYLANTA) 200-200-20 MG/5ML suspension 30 mL  30 mL Oral Q4H PRN Worthy Flank, NP   30 mL at 09/28/14 2340  . benzocaine (ORAJEL) 10 %  mucosal gel   Mouth/Throat QID PRN Jomarie Longs, MD      . busPIRone (BUSPAR) tablet 15 mg  15 mg Oral TID Jomarie Longs, MD   15 mg at 10/01/14 1208  . citalopram (CELEXA) tablet 40 mg  40 mg Oral Daily Worthy Flank, NP   40 mg at 10/01/14 0752  . clindamycin (CLEOCIN) capsule 300 mg  300 mg Oral 3 times per day Jomarie Longs, MD   300 mg at 10/01/14 1333  . gabapentin (NEURONTIN) capsule 900 mg  900 mg Oral TID Worthy Flank, NP   900 mg at 10/01/14 1208  . ibuprofen (ADVIL,MOTRIN) tablet 800 mg  800 mg Oral Q6H PRN Sanjuana Kava, NP   800 mg at 10/01/14 0755  . lidocaine (LIDODERM) 5 % 2 patch  2 patch Transdermal Q24H Rachael Fee, MD   2 patch at 09/30/14 2050  . LORazepam (ATIVAN) tablet 1 mg  1 mg Oral Q6H PRN Rachael Fee, MD   1 mg at 10/01/14 0755  . magnesium hydroxide (MILK OF MAGNESIA) suspension 30 mL  30 mL Oral Daily PRN Worthy Flank, NP   30 mL at 09/28/14 1804  . naproxen (NAPROSYN) tablet 500 mg  500 mg Oral TID WC Jomarie Longs, MD   500 mg at 10/01/14 1208  . nicotine (NICODERM CQ - dosed in mg/24 hours) patch 21 mg  21 mg Transdermal Daily Worthy Flank, NP   21 mg at 10/01/14 0754  . zolpidem (AMBIEN) tablet 10 mg  10 mg Oral QHS Jomarie Longs, MD   10 mg at 09/30/14 2207   Lab Results:  No results found for this or any previous visit (from the past 48 hour(s)).  Physical Findings: AIMS: Facial and Oral Movements Muscles of Facial Expression: None, normal Lips and Perioral Area: None, normal Jaw: None, normal Tongue: None, normal,Extremity Movements Upper (arms, wrists, hands, fingers): None, normal Lower (legs, knees, ankles, toes): None, normal, Trunk Movements Neck, shoulders, hips: None, normal, Overall Severity Severity of abnormal movements (highest score from questions above): None, normal Incapacitation due to abnormal movements: None, normal Patient's awareness of abnormal movements (rate only patient's report): No Awareness, Dental  Status Current problems with teeth and/or dentures?: Yes Does patient usually wear dentures?: No  CIWA:  CIWA-Ar Total: 1 COWS:  COWS Total Score: 2  Treatment Plan Summary: Daily contact with patient to assess and evaluate symptoms and progress in treatment and Medication management Supportive approach/coping skills Opioid dependence; completed detox protocols. Work on a relapse prevention plan Depression; continue to work with the Celexa up to 40 mg daily Anxiety; continue Buspar to 15 mg TID Anxiety/mood instability; continue to work with the Neurontin high dosages Insomnia: Continue Ambien to 10 mg po qhs .  Tooth ache - continue Clindamycin 300 mg po tid for 7 days , orajel  topical, .naproxen 500 mg po tid for pain. Explore residential treatment options.  Medical Decision Making:  Review of Psycho-Social Stressors (1), Review or order clinical lab tests (1), Established Problem, Worsening (2), Review of Last Therapy Session (1), Review of Medication Regimen & Side Effects (2) and Review of New Medication or Change in Dosage (2)  Sanjuana Kava, PMHNP, FNP-BC 10/01/2014, 3:20 PM  Reviewed the information documented and agree with the treatment plan.  Brandell Maready,JANARDHAHA R. 10/01/2014 4:43 PM

## 2014-10-01 NOTE — [facility] Group Notes (Signed)
[facility] Group Notes:  (Nursing/MHT/Case Management/Adjunct)  Date:  10/01/2014  Time:0900  Type of Therapy:  Nurse Education  Participation Level:  Did Not Attend    Summary of Progress/Problems:  Brenda Stafford 10/01/2014, 11:03 AM 

## 2014-10-02 MED ORDER — ZOLPIDEM TARTRATE 10 MG PO TABS: 10.0000 mg | ORAL_TABLET | Freq: Every day | ORAL | Status: DC

## 2014-10-02 MED ORDER — QUETIAPINE FUMARATE 50 MG PO TABS
50.0000 mg | ORAL_TABLET | Freq: Every day | ORAL | Status: DC
  Filled 2014-10-02: qty 1

## 2014-10-02 MED ORDER — BUSPIRONE HCL 15 MG PO TABS: 15.0000 mg | ORAL_TABLET | Freq: Three times a day (TID) | ORAL | Status: DC

## 2014-10-02 MED ORDER — CITALOPRAM HYDROBROMIDE 40 MG PO TABS: 40.0000 mg | ORAL_TABLET | Freq: Every day | ORAL | Status: DC

## 2014-10-02 MED ORDER — GABAPENTIN 600 MG PO TABS
900.0000 mg | ORAL_TABLET | Freq: Three times a day (TID) | ORAL | Status: DC
  Filled 2014-10-02 (×2): qty 32

## 2014-10-02 MED ORDER — QUETIAPINE FUMARATE 50 MG PO TABS: 50.0000 mg | ORAL_TABLET | Freq: Every day | ORAL | Status: DC

## 2014-10-02 MED ORDER — NICOTINE 21 MG/24HR TD PT24
21.0000 mg | MEDICATED_PATCH | Freq: Every day | TRANSDERMAL | Status: DC
Start: 2014-10-02 — End: 2014-12-08

## 2014-10-02 MED ORDER — GABAPENTIN 300 MG PO CAPS: 900.0000 mg | ORAL_CAPSULE | Freq: Three times a day (TID) | ORAL | Status: DC

## 2014-10-02 MED ORDER — LIDOCAINE 5 % EX PTCH: 2.0000 | MEDICATED_PATCH | CUTANEOUS | Status: DC

## 2014-10-02 NOTE — Progress Notes (Signed)
D: Mood is somnolent.  Affect is flat.  Pt in bed this morning and stated that PRN dose of seroquel making pt sleepy.  Pt complained of pain 9/10 in back and legs.    A: Patient given emotional support from RN. Patient encouraged to come to staff with concerns and/or questions. Patient's medication routine continued. Patient's orders and plan of care reviewed. Will continue to monitor patient q15 minutes for safety.  Pt medicated with scheduled pain med.    R: Patient remains appropriate and cooperative.  Pt remained asleep in bed throughout morning.  Pt stated pain med ineffective.

## 2014-10-02 NOTE — Progress Notes (Signed)
Patient ID: Brenda Stafford, female   DOB: [birth year], 29 y.o.   MRN: [id number] Pt discharged from [facility].  AVS reviewed with pt.  Pt stated did not have further questions about discharge.  AVS signed.  Med samples and scripts given to pt.  All belongings returned to pt and belonging sheet signed by pt.  Pt escorted to the lobby.
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
## 2014-10-02 NOTE — Progress Notes (Signed)
  [facility] Adult Case Management Discharge Plan :  Will you be returning to the same living situation after discharge:  Yes,  friend  At discharge, do you have transportation home?: Yes,  friend Do you have the ability to pay for your medications: Yes,  Medicaid out of state  Release of information consent forms completed and submitted to medical records by CSW.  Patient to Follow up at: Follow-up Information    Follow up with [facility].   Why:  Walk in between 8am-9am Monday through Friday for hospital follow-up/medication management/assessment for mental health services.    Contact information:   [address], [place] [address] Phone: [phone] Fax: [phone]      Patient denies SI/HI: Yes,  during group/self report.     Safety Planning and Suicide Prevention discussed: Yes,  SPE completed with pt. SPI pamphlet and mobile crisis information provided to pt and she was encouraged to share information with support network.   Have you used any form of tobacco in the last 30 days? (Cigarettes, Smokeless Tobacco, Cigars, and/or Pipes): Yes  Has patient been referred to the Quitline?: Patient refused referral  Smart, Lebron Quam  10/02/2014, 11:25 AM

## 2014-10-02 NOTE — Progress Notes (Signed)
Recreation Therapy Notes  Date: 09.12.2016 Time: 9:30am Location: [address] Group room   Group Topic: Stress Management  Goal Area(s) Addresses:  Patient will actively participate in stress management techniques presented during session.   Behavioral Response: Did not attend.   Marykay Lex Aditi Rovira, LRT/CTRS        Jearl Klinefelter 10/02/2014 11:37 AM

## 2014-10-02 NOTE — Discharge Summary (Signed)
Physician Discharge Summary Note  Patient:  Brenda Stafford is an 29 y.o., female MRN:  [id number] DOB:  1985-06-10 Patient phone:  [phone] (home)  Patient address:   [place] [address],  Total Time spent with patient: Greater than 30 minutes  Date of Admission:  09/25/2014  Date of Discharge: 10-02-14  Reason for Admission: Opioid detoxification treatments  Principal Problem: MDD (major depressive disorder), recurrent episode, severe  Discharge Diagnoses: Patient Active Problem List   Diagnosis Date Noted  . Major depressive disorder, recurrent, severe without psychotic features [F33.2]   . MDD (major depressive disorder), recurrent episode, severe [F33.2] 09/25/2014  . H/O borderline personality disorder [Z86.59] 09/25/2014  . Opioid use disorder, moderate, dependence [F11.20] 09/25/2014  . 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 Cocaine use disorder, moderate, dependence [F14.20]    Musculoskeletal: Strength & Muscle Tone: within normal limits Gait & Station: normal Patient leans: N/A  Psychiatric Specialty Exam: Physical Exam  Psychiatric: Her speech is normal and behavior is normal. Judgment and thought content normal. Her mood appears not anxious. Her affect is not angry, not blunt, not labile and not inappropriate. Cognition and memory are normal. She does not exhibit a depressed mood.    Review of Systems  Constitutional: Negative.   HENT: Negative.   Eyes: Negative.   Respiratory: Negative.   Cardiovascular: Negative.   Gastrointestinal: Negative.   Genitourinary: Negative.   Musculoskeletal: Negative.   Skin: Negative.   Neurological: Negative.   Endo/Heme/Allergies: Negative.   Psychiatric/Behavioral: Positive for depression (Stable) and substance abuse (Opioid dependence). Negative for suicidal ideas, hallucinations and memory loss. The patient has insomnia (Stable). The patient is not nervous/anxious.     Blood pressure 102/59, pulse 89, temperature 98 F  (36.7 C), temperature source Oral, resp. rate 18, height 5\' 3"  (1.6 m), weight 67.132 kg (148 lb), last menstrual period 09/21/2014.Body mass index is 26.22 kg/(m^2).  See Md's SRA  Have you used any form of tobacco in the last 30 days? (Cigarettes, Smokeless Tobacco, Cigars, and/or Pipes): Yes  Has this patient used any form of tobacco in the last 30 days? (Cigarettes, Smokeless Tobacco, Cigars, and/or Pipes) Yes, A prescription for an FDA-approved tobacco cessation medication was offered at discharge and the patient refused  Past Medical History:  Past Medical History  Diagnosis Date  . Endocarditis   . Osteomyelitis   . Sciatica    History reviewed. No pertinent past surgical history. Family History: History reviewed. No pertinent family history.  Social History:  History  Alcohol Use No     History  Drug Use  . Yes  . Special: Heroin, Cocaine    Comment: heroin    Social History   Social History  . Marital Status: Single    Spouse Name: N/A  . Number of Children: N/A  . Years of Education: N/A   Social History Main Topics  . Smoking status: Current Every Day Smoker -- 1.00 packs/day  . Smokeless tobacco: None  . Alcohol Use: No  . Drug Use: Yes    Special: Heroin, Cocaine     Comment: heroin  . Sexual Activity: Yes   Other Topics Concern  . None   Social History Narrative   Risk to Self: Is patient at risk for suicide?: Yes What has been your use of drugs/alcohol within the last 12 months?: Heroin (approximately $250-300) and crack (approximately $100-150) use daily on admission, has not used meth for approximately 1 month Risk to Others: No Prior Inpatient Therapy:  Yes Prior Outpatient Therapy: Yes  Level of Care:  OP  Hospital Course:  Brenda Stafford is a 29 year old Caucasian female. Admitted to [facility] from the [facility] with complaints of opioid addiction, requesting detoxification treatments. During this assessment, Brenda Stafford reports, "It was 2 days ago  when I walked to the [facility] ED. I had an abcess tooth. While at the ED, I told them that I needed to detox from Heroin. I have used Heroin by injection everyday x 2 years. I was also using any other drugs I could get my hands on. This is my first time being inpatient for my addiction. I have borderline personality disorder & anxiety disorder as well. I was diagnosed with bipolar disorder about 2 years ago. I was put on gabapentin, Celexa & Trazodone. I had overdosed twice in my life time; one overdose was intentional, the other, accidental. I'm also a cutter. I cut to deal & cope with my depression if I was not using drugs. My longest sobriety is about 2 years & that was from 2010 to 2012. After my discharge from this hospital, I will be going back to [place]. I plan on going back to the [facility] for further treatment. Mental illness & substance abuse runs in my family. I don't sleep well at night".  Brenda Stafford was admitted to the unit with her UDS test results positive for opioid & Cocaine. She did admit to having been using IV heroin heroin, including any other drugs she could get her hands on. Although, not actively suicidal, she has had thoughts of wanting to die & had history of suicide attempts. Brenda Stafford was also presenting with symptoms of depression, possibly substance induced. She was in need of opioid detox as well as mood stabilization treatments. After admission assessment/ evaluation, her presenting symptoms were detected & identified. The medication regimen targeting those symptoms were discussed & initiated. She received Clonidine detoxification treatment protocols to combat the withdrawal symptoms of opioid. She was also medicated & discharged on; Buspar 15 mg for anxiety, Citalopram 40 mg for depression, Gabapentin 300 mg for agitation & Seroquel 50 mg for insomnia/mood control. She was also enrolled & participated in the group counseling sessions being offered & held on  this unit. She learned coping skills that should help her further to cope better & manage her depression/substance abuse issues after discharge. Part of her treatment regimen & discharge plans are a referral & an appointment to an outpatient psychiatric clinic for follow-up care.   Brenda Stafford has completed detox treatment & her moodf is now stable. This is evidenced by her reports of improved mood, absence of suicidal ideations & or substance withdrawal symptoms. She is currently being discharged to continue further psychiatric treatment as noted below. She is provided with all the pertinent information needed to make this appointment without problems.  Upon discharge, Brenda Stafford adamantly denies any SIHI, AVH, delusional thoughts, paranoia or substance withdrawal symptoms. She is provided with a 7 days worth, supply samples of her [facility] discharge medications. She left [facility] with all personal belongings in no apparent distress. Transportation per friend.  Consults:  psychiatry  Significant Diagnostic Studies:  labs: CBC with diff, CMP, UDS, toxicology tests, U/A, results reviewed, stable  Discharge Vitals:   Blood pressure 102/59, pulse 89, temperature 98 F (36.7 C), temperature source Oral, resp. rate 18, height 5\' 3"  (1.6 m), weight 67.132 kg (148 lb), last menstrual period 09/21/2014. Body mass index is 26.22 kg/(m^2). Lab  Results:   No results found for this or any previous visit (from the past 72 hour(s)).  Physical Findings: AIMS: Facial and Oral Movements Muscles of Facial Expression: None, normal Lips and Perioral Area: None, normal Jaw: None, normal Tongue: None, normal,Extremity Movements Upper (arms, wrists, hands, fingers): None, normal Lower (legs, knees, ankles, toes): None, normal, Trunk Movements Neck, shoulders, hips: None, normal, Overall Severity Severity of abnormal movements (highest score from questions above): None, normal Incapacitation due to abnormal movements: None,  normal Patient's awareness of abnormal movements (rate only patient's report): No Awareness, Dental Status Current problems with teeth and/or dentures?: Yes Does patient usually wear dentures?: No  CIWA:  CIWA-Ar Total: 1 COWS:  COWS Total Score: 6  See Psychiatric Specialty Exam and Suicide Risk Assessment completed by Attending Physician prior to discharge.  Discharge destination:  Home  Is patient on multiple antipsychotic therapies at discharge:  No   Has Patient had three or more failed trials of antipsychotic monotherapy by history:  No  Recommended Plan for Multiple Antipsychotic Therapies: NA    Medication List    STOP taking these medications        clindamycin 150 MG capsule  Commonly known as:  CLEOCIN      TAKE these medications      Indication   busPIRone 15 MG tablet  Commonly known as:  BUSPAR  Take 1 tablet (15 mg total) by mouth 3 (three) times daily. For anxiety   Indication:  Generalized Anxiety Disorder     citalopram 40 MG tablet  Commonly known as:  CELEXA  Take 1 tablet (40 mg total) by mouth daily. For depression   Indication:  Depression     gabapentin 300 MG capsule  Commonly known as:  NEURONTIN  Take 3 capsules (900 mg total) by mouth 3 (three) times daily. For agitation/substance withdrawal syndrome   Indication:  Agitation, Substance withdrawal syndrome     lidocaine 5 %  Commonly known as:  LIDODERM  Place 2 patches onto the skin daily. Remove & Discard patch within 12 hours or as directed by MD: For pain managment   Indication:  Pain management     nicotine 21 mg/24hr patch  Commonly known as:  NICODERM CQ - dosed in mg/24 hours  Place 1 patch (21 mg total) onto the skin daily. For nicotine addiction   Indication:  Nicotine Addiction     QUEtiapine 50 MG tablet  Commonly known as:  SEROQUEL  Take 1 tablet (50 mg total) by mouth at bedtime. For insomnia/mood control   Indication:  Insomnia/mood control       Follow-up  Information    Follow up with [facility].   Why:  Walk in between 8am-9am Monday through Friday for hospital follow-up/medication management/assessment for mental health services.    Contact information:   [address] [place] [address] Phone: [phone] Fax: [phone]     Follow-up recommendations: Activity:  As tolerated Diet: As recommended by your primary care doctor. Keep all scheduled follow-up appointments as recommended.   Comments: Take all your medications as prescribed by your mental healthcare provider. Report any adverse effects and or reactions from your medicines to your outpatient provider promptly. Patient is instructed and cautioned to not engage in alcohol and or illegal drug use while on prescription medicines. In the event of worsening symptoms, patient is instructed to call the crisis hotline, 911 and or go to the nearest ED for appropriate evaluation and treatment of symptoms. Follow-up  with your primary care provider for your other medical issues, concerns and or health care needs.   Total Discharge Time: Greater than 30 minutes  Signed: Sanjuana Kava, PMHNP, FNP-BC 10/02/2014, 2:06 PM  I personally assessed the patient and formulated the plan Madie Reno A. Dub Mikes, M.D.

## 2014-10-02 NOTE — [facility] Suicide Risk Assessment (Signed)
[facility] INPATIENT:  Family/Significant Other Suicide Prevention Education  Suicide Prevention Education:  Patient Refusal for Family/Significant Other Suicide Prevention Education: The patient Brenda Stafford has refused to provide written consent for family/significant other to be provided Family/Significant Other Suicide Prevention Education during admission and/or prior to discharge.  Physician notified.  SPE completed with pt, as pt refused to consent to family contact. SPI pamphlet provided to pt and pt was encouraged to share information with support network, ask questions, and talk about any concerns relating to SPE. Pt denies access to guns/firearms and verbalized understanding of information provided. Mobile Crisis information also provided to pt.   Pt did not want SPE completed with her contact (mother).   Smart, Brenda Stafford  LCSWA   10/02/2014, 11:24 AM

## 2014-10-02 NOTE — Tx Team (Signed)
Interdisciplinary Treatment Plan Update (Adult) Date: 09/26/14   Time Reviewed: 9:30 AM  Progress in Treatment: Attending groups: No Participating in groups: No Taking medication as prescribed: Yes Tolerating medication: Yes Family/Significant other contact made: SPE completed with pt.  Patient understands diagnosis: Yes Discussing patient identified problems/goals with staff: Yes Medical problems stabilized or resolved: Yes Denies suicidal/homicidal ideation: Yes Issues/concerns per patient self-inventory: Yes Other:  New problem(s) identified: Pt has no been attending groups.   Discharge Plan or Barriers: Patient requesting referral to residential treatment. Patient currently homeless with limited supports and no income. She has not been attending group or discharge planning. Medicaid is out of state. Pt given information and was encouraged to start calling hotline to be placed in [place] rehab. Pt stated that she may d/c and go "outpatient somewhere." Pt minimally vested in treatment. [facility] for outpatient. Given list of shelters, [facility], [facility] info/IRC info.     Reason for Continuation of Hospitalization:  None  Estimated length of stay: d/c today   Comments:  Brenda Stafford is a 29 year old Caucasian female. Admitted to [facility] from the [facility] with complaints of opioid addiction, requesting detoxification treatments. During this assessment, Brenda Stafford reports, "It was 2 days ago when I walked to the [facility] ED. I had an abcess tooth. While at the ED, I told them that I needed to detox from Heroin. I have used Heroin by injection everyday x 2 years. I was also using any other drugs I could get my hands on. This is my first time being inpatient for my addiction. I have borderline personality disorder & anxiety disorder as well. I was diagnosed with bipolar disorder about 2 years ago. I was put on gabapentin, Celexa & Trazodone. I had  overdosed twice in my life time; one overdose was intentional, the other, accidental. I'm also a cutter. I cut to deal & cope with my depression if I was not using drugs. My longest sobriety is about 2 years & that was from 2010 to 2012. After my discharge from this hospital, I will be going back to [place]. I plan on going back to the [place] for further treatment. Mental illness & substance abuse runs in my family. I don't sleep well at night".  Review of initial/current patient goals per problem list:  1. Goal(s): Patient will participate in aftercare plan   Met: Yes    Target date: 3-5 days post admission date   As evidenced by: Patient will participate within aftercare plan AEB aftercare provider and housing plan at discharge being identified.  9/6: Goal not met: CSW assessing for appropriate referrals for pt and will have follow up secured prior to d/c.   9/9:  goal not met. Pt has not been participating or attending groups and did not attend d/c planning group. CSW continuing to assess for referrals. Out of state medicaid.   9/12: pt to go with friend at d/c. [facility] for outpatient services.    2. Goal (s): Patient will exhibit decreased depressive symptoms and suicidal ideations.   Met: Yes    Target date: 3-5 days post admission date   As evidenced by: Patient will utilize self rating of depression at 3 or below and demonstrate decreased signs of depression or be deemed stable for discharge by MD.  9/6: Goal not met: Pt presents with flat affect and depressed mood.  Pt admitted with depression rating of 10.  Pt to show  decreased sign of depression and a rating of 3 or less before d/c.     Goal not met. Pt rates depression as high and presents with depressed mood and flat/lethargic affect. Denies SI/HI/AVH.    9/12: Goal met. Pt reports no depression, denies SI/HI/AVH.   3. Goal(s): Patient will demonstrate decreased signs and symptoms of anxiety.    Met: Yes    Target date: 3-5 days post admission date   As evidenced by: Patient will utilize self rating of anxiety at 3 or below and demonstrated decreased signs of anxiety, or be deemed stable for discharge by MD  9/6: Goal not met: Pt presents with anxious mood and affect.  Pt admitted with anxiety rating of 10.  Pt to show decreased sign of anxiety and a rating of 3 or less before d/c.   9/9: Goal not met. Pt continues to rate anxiety as high. Presents with depressed mood/flat affect.    9/12: Goal met. Pt denies anxiety. Presents as calm/lethargic.   4. Goal(s): Patient will demonstrate decreased signs of withdrawal due to substance abuse   Met: Yes    Target date: 3-5 days post admission date  9/6: Patient with COWS score of 5 today, experiencing anxiety, tremor and GI upset.   9/9:  Pt reports mild withdrawals with COWS of 2 and low standing/sitting BP.    9/12: Pt reports no withdrawal symptoms with no COWS. Low BP-per MD pt is medically stable for d/c today.   Attendees: Patient:    Family:    Physician: Dr. Parke Poisson; 10/02/2014 11:26 AM   Nursing: Trinna Post RN; Sharl Ma RN; Adam RN  10/02/2014 11:26 AM   Clinical Social Worker: Tilden Fossa, [place] 10/02/2014 11:26 AM   Other:Afnan Emberton Smart LCSWA 10/02/2014 11:26 AM   Other: Lucinda Dell, [place] Sessions Liaison 10/02/2014 11:26 AM   Other: Lars Pinks, Case Manager 10/02/2014 11:26 AM   Other: Ave Filter, NP 10/02/2014 11:26 AM   Other:    Other:    Other:         Scribe for Treatment Team:  Maxie Better, [place] Social Worker 10/02/2014 11:26 AM

## 2014-10-02 NOTE — B[facility] Group Notes (Signed)
[facility] LCSW Aftercare Discharge Planning Group Note   10/02/2014 11:23 AM  Participation Quality:  DID NOT ATTEND. CSW attempted to wake pt to discuss d/c today and plan-pt signed 72 hour d/c on Friday. Pt did not get out of bed.   Smart, [employer]

## 2014-10-13 NOTE — Clinical Social Work Note (Signed)
Patient has care coordinator w Shelly Coss, Darnelle Maffucci.  [place], LCSW Clinical Social Worker

## 2014-10-18 ENCOUNTER — Emergency Department (HOSPITAL_COMMUNITY)
Admission: EM | Admit: 2014-10-18 | Discharge: 2014-10-18 | Disposition: A | Discharge status: Home / Self Care | Payer: Medicaid - Out of State | Attending: Emergency Medicine | Admitting: Emergency Medicine

## 2014-10-18 ENCOUNTER — Emergency Department (HOSPITAL_COMMUNITY): Payer: Medicaid - Out of State

## 2014-10-18 ENCOUNTER — Encounter (HOSPITAL_COMMUNITY): Payer: Self-pay | Admitting: Emergency Medicine

## 2014-10-18 DIAGNOSIS — Z8679 Personal history of other diseases of the circulatory system: Secondary | ICD-10-CM | POA: Diagnosis not present

## 2014-10-18 DIAGNOSIS — Z8739 Personal history of other diseases of the musculoskeletal system and connective tissue: Secondary | ICD-10-CM | POA: Insufficient documentation

## 2014-10-18 DIAGNOSIS — R509 Fever, unspecified: Secondary | ICD-10-CM | POA: Diagnosis not present

## 2014-10-18 DIAGNOSIS — R109 Unspecified abdominal pain: Secondary | ICD-10-CM

## 2014-10-18 DIAGNOSIS — J029 Acute pharyngitis, unspecified: Secondary | ICD-10-CM | POA: Insufficient documentation

## 2014-10-18 DIAGNOSIS — Z880 Allergy status to penicillin: Secondary | ICD-10-CM | POA: Diagnosis not present

## 2014-10-18 DIAGNOSIS — N390 Urinary tract infection, site not specified: Secondary | ICD-10-CM | POA: Diagnosis not present

## 2014-10-18 DIAGNOSIS — Z3202 Encounter for pregnancy test, result negative: Secondary | ICD-10-CM | POA: Insufficient documentation

## 2014-10-18 DIAGNOSIS — Z79899 Other long term (current) drug therapy: Secondary | ICD-10-CM | POA: Insufficient documentation

## 2014-10-18 DIAGNOSIS — R590 Localized enlarged lymph nodes: Secondary | ICD-10-CM | POA: Diagnosis not present

## 2014-10-18 LAB — DIFFERENTIAL
Basophils Absolute: 0.1 10*3/uL (ref 0.0–0.1)
Basophils Relative: 1 %
Eosinophils Absolute: 0.2 10*3/uL (ref 0.0–0.7)
Eosinophils Relative: 2 %
LYMPHS PCT: 27 %
Lymphs Abs: 3.1 10*3/uL (ref 0.7–4.0)
MONOS PCT: 16 %
Monocytes Absolute: 1.9 10*3/uL — ABNORMAL HIGH (ref 0.1–1.0)
NEUTROS ABS: 6.3 10*3/uL (ref 1.7–7.7)
NEUTROS PCT: 54 %

## 2014-10-18 LAB — CBC
HEMATOCRIT: 41.3 % (ref 36.0–46.0)
HEMOGLOBIN: 14.1 g/dL (ref 12.0–15.0)
MCH: 31.7 pg (ref 26.0–34.0)
MCHC: 34.1 g/dL (ref 30.0–36.0)
MCV: 92.8 fL (ref 78.0–100.0)
Platelets: 252 10*3/uL (ref 150–400)
RBC: 4.45 MIL/uL (ref 3.87–5.11)
RDW: 13.8 % (ref 11.5–15.5)
WBC: 11.6 10*3/uL — ABNORMAL HIGH (ref 4.0–10.5)

## 2014-10-18 LAB — BASIC METABOLIC PANEL
ANION GAP: 8 (ref 5–15)
BUN: 9 mg/dL (ref 6–20)
CHLORIDE: 103 mmol/L (ref 101–111)
CO2: 22 mmol/L (ref 22–32)
Calcium: 8.9 mg/dL (ref 8.9–10.3)
Creatinine, Ser: 0.97 mg/dL (ref 0.44–1.00)
GFR calc Af Amer: >60 mL/min (ref >60)
GLUCOSE: 86 mg/dL (ref 65–99)
POTASSIUM: 3.2 mmol/L — AB (ref 3.5–5.1)
Sodium: 133 mmol/L — ABNORMAL LOW (ref 135–145)

## 2014-10-18 LAB — URINALYSIS, ROUTINE W REFLEX MICROSCOPIC
Bilirubin Urine: NEGATIVE
GLUCOSE, UA: NEGATIVE mg/dL
Ketones, ur: NEGATIVE mg/dL
Nitrite: POSITIVE — AB
PH: 6 (ref 5.0–8.0)
Protein, ur: 30 mg/dL — AB
Specific Gravity, Urine: 1.015 (ref 1.005–1.030)
Urobilinogen, UA: 1 mg/dL (ref 0.0–1.0)

## 2014-10-18 LAB — POC URINE PREG, ED: Preg Test, Ur: NEGATIVE

## 2014-10-18 LAB — URINE MICROSCOPIC-ADD ON

## 2014-10-18 MED ORDER — FENTANYL CITRATE (PF) 100 MCG/2ML IJ SOLN
50.0000 ug | Freq: Once | INTRAMUSCULAR | Status: AC
  Administered 2014-10-18: 50 ug via INTRAVENOUS
  Filled 2014-10-18: qty 2

## 2014-10-18 MED ORDER — HYDROCODONE-ACETAMINOPHEN 7.5-325 MG/15ML PO SOLN: 10.0000 mL | Freq: Four times a day (QID) | ORAL | Status: DC | PRN

## 2014-10-18 MED ORDER — PREDNISONE 5 MG/5ML PO SOLN: ORAL | Status: DC

## 2014-10-18 MED ORDER — ONDANSETRON HCL 4 MG/2ML IJ SOLN
4.0000 mg | Freq: Once | INTRAMUSCULAR | Status: AC
  Administered 2014-10-18: 4 mg via INTRAVENOUS
  Filled 2014-10-18: qty 2

## 2014-10-18 MED ORDER — CLINDAMYCIN PALMITATE HCL 75 MG/5ML PO SOLR: 300.0000 mg | Freq: Three times a day (TID) | ORAL | Status: DC

## 2014-10-18 MED ORDER — FENTANYL CITRATE (PF) 100 MCG/2ML IJ SOLN
100.0000 ug | Freq: Once | INTRAMUSCULAR | Status: AC
  Administered 2014-10-18: 100 ug via INTRAVENOUS
  Filled 2014-10-18: qty 2

## 2014-10-18 MED ORDER — ACETAMINOPHEN 325 MG PO TABS
650.0000 mg | ORAL_TABLET | Freq: Once | ORAL | Status: AC
  Administered 2014-10-18: 650 mg via ORAL
  Filled 2014-10-18: qty 2

## 2014-10-18 MED ORDER — IOHEXOL 300 MG/ML  SOLN
75.0000 mL | Freq: Once | INTRAMUSCULAR | Status: AC | PRN
  Administered 2014-10-18: 75 mL via INTRAVENOUS

## 2014-10-18 MED ORDER — CIPROFLOXACIN HCL 500 MG PO TABS: 500.0000 mg | ORAL_TABLET | Freq: Two times a day (BID) | ORAL | Status: DC

## 2014-10-18 MED ORDER — SODIUM CHLORIDE 0.9 % IV BOLUS (SEPSIS)
1000.0000 mL | Freq: Once | INTRAVENOUS | Status: AC
  Administered 2014-10-18: 1000 mL via INTRAVENOUS

## 2014-10-18 MED ORDER — CLINDAMYCIN PHOSPHATE 600 MG/50ML IV SOLN
600.0000 mg | Freq: Once | INTRAVENOUS | Status: AC
  Administered 2014-10-18: 600 mg via INTRAVENOUS
  Filled 2014-10-18: qty 50

## 2014-10-18 NOTE — Progress Notes (Signed)
Patient listed as not having a pcp and with Medicaid out of state.  EDCM spoke to patient at bedside.  Patient reports she lives in [place] and plans on returning there by the end of the month.  Patient reports she does not have a pcp in [place].  Patient confirms she is homeless and is currently staying at a hotel.  Patient requesting list of food pantries in the area.  EDCM provided patient with list of food pantries in [place].Marland Kitchen  [facility] provide patient with contact information  to [facility], informed patient of services there.  EDCM also provided patient with list of pcps who accept self pay patients, list of discount pharmacies and websites needymeds.org and GoodRX.com for medication assistance, phone number to inquire about the orange card, phone number to inquire about Mediciad, phone number to inquire about the Affordable Care Act, financial resources in the community such as local churches, [employer], urban ministries, [facility] and dental assistance for uninsured patients.  Patient thankful for resources.  No further EDCM needs at this time.

## 2014-10-18 NOTE — ED Notes (Signed)
Mild swelling observed to upper right neck/tenderness, no redness. Poor oral hygiene, small amount of swelling seen in upper posterior throat, no obstruction to airway visualized. Tenderness to palpation at right flank, urine cloudy/amber, no obvious blood/clotting/sediment to urine. Temp 99.3 in triage.

## 2014-10-18 NOTE — ED Notes (Signed)
Awake. Verbally responsive. A/O x4. Resp even and unlabored. No audible adventitious breath sounds noted. ABC's intact. IV saline lock patent and intact. SR on monitor.

## 2014-10-18 NOTE — ED Notes (Signed)
Awake. Verbally responsive. A/O x4. Resp even and unlabored. No audible adventitious breath sounds noted. ABC's intact.  

## 2014-10-18 NOTE — ED Notes (Signed)
Pt had no adverse reaction to ABT. 

## 2014-10-18 NOTE — ED Notes (Signed)
Per EMS, 1 week prior began having urinary frequency, 3 days prior began having hematuria with pain to right flank, thought she was having a kidney stone. Also began having right/left neck stiffening with right neck swelling/tenderness. Last period was in July, unknown if pregnant. Cold chills with sweating since yesterday. Pt came from [place], EMS unable to confirm if any drugs in her system.

## 2014-10-18 NOTE — Discharge Instructions (Signed)
You have some swelling in your throat associated with an infection. You also have a urinary tract infection. If you develop trouble breathing, swallowing or speaking, or you neck gets stiff, return to the ER immediately. Otherwise follow up with the ENT specialist as above in 1-2 weeks.

## 2014-10-18 NOTE — ED Notes (Signed)
Attempted IV venipuncture x1 without success. Noted fresh and fading tracks marks to bil arms. Pt stated, "I'm an IV drug user".

## 2014-10-18 NOTE — ED Provider Notes (Signed)
CSN: [id number]     Arrival date & time 10/18/14  1307 History   First MD Initiated Contact with Patient 10/18/14 1309     Chief Complaint  Patient presents with  . Neck Pain  . Flank Pain  . Hematuria     (Consider location/radiation/quality/duration/timing/severity/associated sxs/prior Treatment) HPI  29 year old female presents with 2 separate complaints. Primary complaint is right sided neck pain with neck swelling. His been present for the past 3 days. Is quite painful. Patient has a history of IV drug abuse and has had a history of osteomyelitis and endocarditis in the past. She denies any dental pain or gum swelling. No trouble swallowing. Denies any cough or shortness of breath. Has been having a fever, has not taken her temperature. Has been having dysuria, frequency, and urgency over the past 1 week. Developed hematuria 3 days ago as well as right back pain that radiated to her groin. Felt like prior kidney stones. Currently rates her pain as severe, most prominent in her neck.  Past Medical History  Diagnosis Date  . Endocarditis   . Osteomyelitis   . Sciatica    History reviewed. No pertinent past surgical history. History reviewed. No pertinent family history. Social History  Substance Use Topics  . Smoking status: Current Every Day Smoker -- 1.00 packs/day  . Smokeless tobacco: None  . Alcohol Use: No   OB History    No data available     Review of Systems  Constitutional: Positive for fever.  HENT: Negative for dental problem and sore throat.   Gastrointestinal: Negative for abdominal pain.  Genitourinary: Positive for dysuria, frequency and hematuria.  Musculoskeletal: Positive for back pain and neck pain.  All other systems reviewed and are negative.     Allergies  Bactrim; Keflex; Penicillins; and Amoxicillin  Home Medications   Prior to Admission medications   Medication Sig Start Date End Date Taking? Authorizing Beckie Viscardi  busPIRone (BUSPAR) 15  MG tablet Take 1 tablet (15 mg total) by mouth 3 (three) times daily. For anxiety 10/02/14  Yes Sanjuana Kava, NP  gabapentin (NEURONTIN) 300 MG capsule Take 3 capsules (900 mg total) by mouth 3 (three) times daily. For agitation/substance withdrawal syndrome 10/02/14  Yes Sanjuana Kava, NP  Ibuprofen-Diphenhydramine HCl (ADVIL PM) 200-25 MG CAPS Take 2 tablets by mouth once.   Yes Historical Yechezkel Fertig, MD  lidocaine (LIDODERM) 5 % Place 2 patches onto the skin daily. Remove & Discard patch within 12 hours or as directed by MD: For pain managment 10/02/14  Yes Sanjuana Kava, NP  nicotine (NICODERM CQ - DOSED IN MG/24 HOURS) 21 mg/24hr patch Place 1 patch (21 mg total) onto the skin daily. For nicotine addiction 10/02/14  Yes Sanjuana Kava, NP  QUEtiapine (SEROQUEL) 50 MG tablet Take 1 tablet (50 mg total) by mouth at bedtime. For insomnia/mood control 10/02/14  Yes Sanjuana Kava, NP  citalopram (CELEXA) 40 MG tablet Take 1 tablet (40 mg total) by mouth daily. For depression 10/02/14   Sanjuana Kava, NP   BP 120/50 mmHg  Pulse 90  Temp(Src) 100.6 F (38.1 C) (Rectal)  Resp 18  SpO2 100%  LMP 07/21/2014 Physical Exam  Constitutional: She is oriented to person, place, and time. She appears well-developed and well-nourished.  HENT:  Head: Normocephalic and atraumatic.  Right Ear: External ear normal.  Left Ear: External ear normal.  Nose: Nose normal.  Mouth/Throat: Oropharynx is clear and moist.  Overall poor dentition, no acute  dental abscess or gingival abscess. No pharyngitis or tonsillitis  Eyes: Right eye exhibits no discharge. Left eye exhibits no discharge.  Neck: Neck supple. Muscular tenderness present. No rigidity. No erythema present.    Cardiovascular: Normal rate, regular rhythm and normal heart sounds.   Pulmonary/Chest: Effort normal and breath sounds normal. No stridor. She has no wheezes. She has no rales.  Abdominal: Soft. She exhibits no distension. There is no tenderness.  There is CVA tenderness (right).  Neurological: She is alert and oriented to person, place, and time.  Skin: Skin is warm and dry.  Nursing note and vitals reviewed.   ED Course  Procedures (including critical care time) Labs Review Labs Reviewed  URINALYSIS, ROUTINE W REFLEX MICROSCOPIC (NOT AT [facility]) - Abnormal; Notable for the following:    Color, Urine AMBER (*)    APPearance TURBID (*)    Hgb urine dipstick LARGE (*)    Protein, ur 30 (*)    Nitrite POSITIVE (*)    Leukocytes, UA LARGE (*)    All other components within normal limits  BASIC METABOLIC PANEL - Abnormal; Notable for the following:    Sodium 133 (*)    Potassium 3.2 (*)    All other components within normal limits  CBC - Abnormal; Notable for the following:    WBC 11.6 (*)    All other components within normal limits  DIFFERENTIAL - Abnormal; Notable for the following:    Monocytes Absolute 1.9 (*)    All other components within normal limits  URINE CULTURE  CULTURE, BLOOD (ROUTINE X 2)  CULTURE, BLOOD (ROUTINE X 2)  URINE MICROSCOPIC-ADD ON  POC URINE PREG, ED    Imaging Review Ct Soft Tissue Neck W Contrast  10/18/2014   CLINICAL DATA:  Neck and throat swelling.  History of IV drug use.  EXAM: CT NECK WITH CONTRAST  TECHNIQUE: Multidetector CT imaging of the neck was performed using the standard protocol following the bolus administration of intravenous contrast.  CONTRAST:  75mL OMNIPAQUE IOHEXOL 300 MG/ML  SOLN  COMPARISON:  None.  FINDINGS: Pharynx and larynx: No tonsillar or peritonsillar abscess. Retropharyngeal effusion without definable abscess.  Normal larynx.  No airway compromise.  Salivary glands: Unremarkable  Thyroid: Unremarkable  Lymph nodes: BILATERAL level II adenopathy. Short axis measurement on the RIGHT 16 mm as seen on image 39. 11 mm on the LEFT at a similar location. Smaller level III, IV, and V nodes. BILATERAL retropharyngeal nodes, greater on the RIGHT.  Vascular: Negative.  Limited  intracranial: Negative.  Visualized orbits: Negative.  Mastoids and visualized paranasal sinuses: Negative.  Skeleton: Spondylosis with disc space narrowing at C5-6. No worrisome osseous lesion. Poor dentition with periapical lucencies and dental caries. Multiple teeth missing.  Upper chest: No lung apex lesion.  IMPRESSION: Retropharyngeal effusion without definable abscess. No tonsillar or peritonsillar inflammation or abscess. Significant BILATERAL level II adenopathy, RIGHT greater than LEFT likely reactive. Etiology unclear, but suspect some type of inflammatory process related to IV drug use, either systemic or local. No pulmonary nodules or abscesses to suggest septic emboli.   Electronically Signed   By: Elsie Stain M.D.   On: 10/18/2014 15:31   Ct Renal Stone Study  10/18/2014   CLINICAL DATA:  Right flank tenderness.  Cloudy urine, hematuria.  EXAM: CT ABDOMEN AND PELVIS WITHOUT CONTRAST  TECHNIQUE: Multidetector CT imaging of the abdomen and pelvis was performed following the standard protocol without IV contrast.  COMPARISON:  None.  FINDINGS: Lung  bases are clear.  No effusions.  Heart is normal size.  Liver, gallbladder, spleen, pancreas, adrenals are unremarkable. Punctate nonobstructing stones in the upper and lower poles of the right kidney. No renal stones on the left. No ureteral stones or hydronephrosis. Multiple calcifications in the pelvis compatible with phleboliths. Uterus, adnexae and urinary bladder grossly unremarkable.  Stomach, large and small bowel are unremarkable. Appendix is visualized and is normal. No free fluid, free air or adenopathy. No acute bony abnormality or focal bone lesion.  IMPRESSION: Punctate right nephrolithiasis. No ureteral stones or hydronephrosis.   Electronically Signed   By: Charlett Nose M.D.   On: 10/18/2014 15:13   I have personally reviewed and evaluated these images and lab results as part of my medical decision-making.   EKG Interpretation None        MDM   Final diagnoses:  Right flank pain  Pharyngitis  UTI (lower urinary tract infection)  Cervical adenopathy    Patient has no ureteral stone on CT imaging. Does have a urinary tract infection for which she is symptomatic. Plan to treat with antibiotics. Patient's CT scan of her neck shows significant cervical adenopathy, right greater than left. No abscess. She does have retropharyngeal edema but no abscess. Discussed with ENT on call, Dr. Emeline Darling, who recommends oral liquid clindamycin as well as an oral liquid prednisone taper starting at 40 mg. Will seek patient in clinic in 1-2 weeks. Does not need IV antibiotics. Likely caused by her IV drug abuse which I have discussed stopping. Patient has no signs of airway compromise, speaking in normal complete sentences without stridor, drooling, or muffled sounds. No neck stiffness. Plan to discharge home with strict return precautions. Will need to different antibiotics as clindamycin will not cover UTI.    Pricilla Loveless, MD 10/18/14 [id number]

## 2014-10-18 NOTE — ED Notes (Signed)
Bed: WA23 Expected date:  Expected time:  Means of arrival:  Comments: EMS 

## 2014-10-18 NOTE — ED Notes (Signed)
Awake. Verbally responsive. A/O x4. Resp even and unlabored. No audible adventitious breath sounds noted. ABC's intact. SR on monitor. IV infusing ABT without difficulty. Pt given meal tray.

## 2014-10-18 NOTE — ED Notes (Signed)
Awake. Verbally responsive. A/O x4. Resp even and unlabored. No audible adventitious breath sounds noted. ABC's intact. IV infusing NS at 999ml/hr without difficulty. 

## 2014-10-22 LAB — URINE CULTURE

## 2014-10-24 ENCOUNTER — Telehealth (HOSPITAL_COMMUNITY): Payer: Self-pay

## 2014-10-24 NOTE — Telephone Encounter (Signed)
Post ED Visit - Positive Culture Follow-up  Culture report reviewed by antimicrobial stewardship pharmacist:   Celedonio Miyamoto, Pharm.D., BCPS  Georgina Pillion, Pharm.D., BCPS  [place], [address].D., BCPS, AAHIVP  Estella Husk, Pharm.D., BCPS, AAHIVP  Cristy Reyes, [address].D.  Tennis Must, Pharm.D.  Positive Urine culture, >/= 100,000 colonies -> E Coli Treated with Ciprofloxacin, organism sensitive to the same and no further patient follow-up is required at this time.  Arvid Right 10/24/2014, 4:49 AM

## 2014-12-01 ENCOUNTER — Encounter (HOSPITAL_COMMUNITY): Payer: Self-pay | Admitting: Emergency Medicine

## 2014-12-01 ENCOUNTER — Emergency Department (HOSPITAL_COMMUNITY)
Admission: EM | Admit: 2014-12-01 | Discharge: 2014-12-02 | Disposition: A | Discharge status: Other Acute Inpatient Hospital | Payer: Medicaid - Out of State | Attending: Emergency Medicine | Admitting: Emergency Medicine

## 2014-12-01 DIAGNOSIS — Z880 Allergy status to penicillin: Secondary | ICD-10-CM | POA: Diagnosis not present

## 2014-12-01 DIAGNOSIS — Z79899 Other long term (current) drug therapy: Secondary | ICD-10-CM | POA: Insufficient documentation

## 2014-12-01 DIAGNOSIS — Z720 Tobacco use: Secondary | ICD-10-CM | POA: Diagnosis not present

## 2014-12-01 DIAGNOSIS — Z3202 Encounter for pregnancy test, result negative: Secondary | ICD-10-CM | POA: Insufficient documentation

## 2014-12-01 DIAGNOSIS — F1410 Cocaine abuse, uncomplicated: Secondary | ICD-10-CM | POA: Insufficient documentation

## 2014-12-01 DIAGNOSIS — F319 Bipolar disorder, unspecified: Secondary | ICD-10-CM | POA: Insufficient documentation

## 2014-12-01 DIAGNOSIS — F1124 Opioid dependence with opioid-induced mood disorder: Secondary | ICD-10-CM | POA: Insufficient documentation

## 2014-12-01 DIAGNOSIS — R45851 Suicidal ideations: Secondary | ICD-10-CM | POA: Diagnosis present

## 2014-12-01 DIAGNOSIS — F1120 Opioid dependence, uncomplicated: Secondary | ICD-10-CM

## 2014-12-01 HISTORY — DX: Opioid dependence, uncomplicated: F11.20

## 2014-12-01 HISTORY — DX: Cocaine dependence, uncomplicated: F14.20

## 2014-12-01 HISTORY — DX: Suicidal ideations: R45.851

## 2014-12-01 HISTORY — DX: Bipolar disorder, unspecified: F31.9

## 2014-12-01 HISTORY — DX: Depression, unspecified: F32.A

## 2014-12-01 HISTORY — DX: Personality disorder, unspecified: F60.9

## 2014-12-01 HISTORY — DX: Major depressive disorder, single episode, unspecified: F32.9

## 2014-12-01 HISTORY — DX: Anxiety disorder, unspecified: F41.9

## 2014-12-01 LAB — CBC
HEMATOCRIT: 36 % (ref 36.0–46.0)
Hemoglobin: 11.8 g/dL — ABNORMAL LOW (ref 12.0–15.0)
MCH: 29.6 pg (ref 26.0–34.0)
MCHC: 32.8 g/dL (ref 30.0–36.0)
MCV: 90.5 fL (ref 78.0–100.0)
Platelets: 349 10*3/uL (ref 150–400)
RBC: 3.98 MIL/uL (ref 3.87–5.11)
RDW: 14.3 % (ref 11.5–15.5)
WBC: 10.8 10*3/uL — ABNORMAL HIGH (ref 4.0–10.5)

## 2014-12-01 LAB — COMPREHENSIVE METABOLIC PANEL
ALK PHOS: 76 U/L (ref 38–126)
ALT: 61 U/L — ABNORMAL HIGH (ref 14–54)
ANION GAP: 10 (ref 5–15)
AST: 61 U/L — ABNORMAL HIGH (ref 15–41)
Albumin: 3.5 g/dL (ref 3.5–5.0)
BILIRUBIN TOTAL: 0.7 mg/dL (ref 0.3–1.2)
BUN: 7 mg/dL (ref 6–20)
CALCIUM: 9.3 mg/dL (ref 8.9–10.3)
CO2: 24 mmol/L (ref 22–32)
Chloride: 103 mmol/L (ref 101–111)
Creatinine, Ser: 0.83 mg/dL (ref 0.44–1.00)
GFR calc non Af Amer: >60 mL/min (ref >60)
Glucose, Bld: 86 mg/dL (ref 65–99)
Potassium: 3.9 mmol/L (ref 3.5–5.1)
Sodium: 137 mmol/L (ref 135–145)
TOTAL PROTEIN: 6.9 g/dL (ref 6.5–8.1)

## 2014-12-01 LAB — SALICYLATE LEVEL

## 2014-12-01 LAB — ACETAMINOPHEN LEVEL

## 2014-12-01 LAB — RAPID URINE DRUG SCREEN, HOSP PERFORMED
Amphetamines: NOT DETECTED
Barbiturates: NOT DETECTED
Benzodiazepines: NOT DETECTED
COCAINE: POSITIVE — AB
OPIATES: POSITIVE — AB
Tetrahydrocannabinol: NOT DETECTED

## 2014-12-01 LAB — ETHANOL: Alcohol, Ethyl (B): <5 mg/dL (ref <5)

## 2014-12-01 LAB — POC URINE PREG, ED: PREG TEST UR: NEGATIVE

## 2014-12-01 MED ORDER — LOPERAMIDE HCL 2 MG PO CAPS: 2.0000 mg | ORAL_CAPSULE | ORAL | Status: DC | PRN

## 2014-12-01 MED ORDER — HYDROXYZINE HCL 25 MG PO TABS
25.0000 mg | ORAL_TABLET | Freq: Four times a day (QID) | ORAL | Status: DC | PRN
  Administered 2014-12-01 – 2014-12-02 (×2): 25 mg via ORAL
  Filled 2014-12-01 (×3): qty 1

## 2014-12-01 MED ORDER — CLONIDINE HCL 0.1 MG PO TABS
0.1000 mg | ORAL_TABLET | Freq: Four times a day (QID) | ORAL | Status: DC
  Administered 2014-12-01 (×2): 0.1 mg via ORAL
  Filled 2014-12-01 (×3): qty 1

## 2014-12-01 MED ORDER — CLONIDINE HCL 0.1 MG PO TABS: 0.1000 mg | ORAL_TABLET | Freq: Every day | ORAL | Status: DC

## 2014-12-01 MED ORDER — CLONIDINE HCL 0.1 MG PO TABS: 0.1000 mg | ORAL_TABLET | ORAL | Status: DC

## 2014-12-01 MED ORDER — NAPROXEN 250 MG PO TABS: 500.0000 mg | ORAL_TABLET | Freq: Two times a day (BID) | ORAL | Status: DC | PRN

## 2014-12-01 MED ORDER — ZOLPIDEM TARTRATE 5 MG PO TABS
5.0000 mg | ORAL_TABLET | Freq: Every evening | ORAL | Status: DC | PRN
  Administered 2014-12-01: 5 mg via ORAL
  Filled 2014-12-01: qty 1

## 2014-12-01 MED ORDER — DICYCLOMINE HCL 20 MG PO TABS: 20.0000 mg | ORAL_TABLET | Freq: Four times a day (QID) | ORAL | Status: DC | PRN

## 2014-12-01 MED ORDER — ACETAMINOPHEN 325 MG PO TABS: 650.0000 mg | ORAL_TABLET | ORAL | Status: DC | PRN

## 2014-12-01 MED ORDER — ONDANSETRON 4 MG PO TBDP: 4.0000 mg | ORAL_TABLET | Freq: Four times a day (QID) | ORAL | Status: DC | PRN

## 2014-12-01 MED ORDER — ONDANSETRON HCL 4 MG PO TABS: 4.0000 mg | ORAL_TABLET | Freq: Three times a day (TID) | ORAL | Status: DC | PRN

## 2014-12-01 MED ORDER — IBUPROFEN 400 MG PO TABS: 600.0000 mg | ORAL_TABLET | Freq: Three times a day (TID) | ORAL | Status: DC | PRN

## 2014-12-01 MED ORDER — NICOTINE 21 MG/24HR TD PT24
21.0000 mg | MEDICATED_PATCH | Freq: Every day | TRANSDERMAL | Status: DC
  Administered 2014-12-01 – 2014-12-02 (×2): 21 mg via TRANSDERMAL
  Filled 2014-12-01 (×3): qty 1

## 2014-12-01 MED ORDER — METHOCARBAMOL 500 MG PO TABS
500.0000 mg | ORAL_TABLET | Freq: Three times a day (TID) | ORAL | Status: DC | PRN
  Administered 2014-12-01 – 2014-12-02 (×2): 500 mg via ORAL
  Filled 2014-12-01 (×3): qty 1

## 2014-12-01 NOTE — ED Notes (Signed)
Ordered breakfast, took valuables to security for safe and documented belongings.

## 2014-12-01 NOTE — ED Notes (Signed)
Pt given lunch tray.

## 2014-12-01 NOTE — ED Notes (Signed)
Pt ambulatory w/ steady gait to restroom. 

## 2014-12-01 NOTE — ED Provider Notes (Signed)
CSN: [id number]   Arrival date & time 12/01/14 0136  History  By signing my name below, I, Bethel Born, attest that this documentation has been prepared under the direction and in the presence of Azalia Bilis, MD. Electronically Signed: Bethel Born, ED Scribe. 12/01/2014. 2:49 AM.  Chief Complaint  Patient presents with  . Suicidal    HPI The history is provided by the patient. No language interpreter was used.   Brenda Stafford is a 29 y.o. female with history of bipolar 1 disorder, depression, and heroin addiction  who presents to the Emergency Department complaining of SI with a plan to stab herself. She has been having these thought for months but they worsened in the last several days. Pt states that she is a heroin "addict" and "I can't take it anymore, I need help" and that she is "going to wind up killing [herself]". She states "I had the knife in my hand tonight, I was going to cut my throat. She last used heroin and cocaine 2 hours ago and Percocet 3 days ago.   Past Medical History  Diagnosis Date  . Endocarditis   . Osteomyelitis (HCC)   . Sciatica   . Bipolar 1 disorder (HCC)   . Personality disorder   . Depression   . Heroin addiction (HCC)     History reviewed. No pertinent past surgical history.  No family history on file.  Social History  Substance Use Topics  . Smoking status: Current Every Day Smoker -- 0.00 packs/day    Types: Cigarettes  . Smokeless tobacco: None  . Alcohol Use: No     Review of Systems 10 Systems reviewed and all are negative for acute change except as noted in the HPI. Home Medications   Prior to Admission medications   Medication Sig Start Date End Date Taking? Authorizing Provider  busPIRone (BUSPAR) 15 MG tablet Take 1 tablet (15 mg total) by mouth 3 (three) times daily. For anxiety 10/02/14   Sanjuana Kava, NP  ciprofloxacin (CIPRO) 500 MG tablet Take 1 tablet (500 mg total) by mouth 2 (two) times daily. One po bid x 7 days  10/18/14   Pricilla Loveless, MD  citalopram (CELEXA) 40 MG tablet Take 1 tablet (40 mg total) by mouth daily. For depression 10/02/14   Sanjuana Kava, NP  clindamycin (CLEOCIN) 75 MG/5ML solution Take 20 mLs (300 mg total) by mouth 3 (three) times daily. For 10 days 10/18/14   Pricilla Loveless, MD  gabapentin (NEURONTIN) 300 MG capsule Take 3 capsules (900 mg total) by mouth 3 (three) times daily. For agitation/substance withdrawal syndrome 10/02/14   Sanjuana Kava, NP  HYDROcodone-acetaminophen (HYCET) 7.5-325 mg/15 ml solution Take 10 mLs by mouth every 6 (six) hours as needed for moderate pain or severe pain. 10/18/14 10/18/15  Pricilla Loveless, MD  Ibuprofen-Diphenhydramine HCl (ADVIL PM) 200-25 MG CAPS Take 2 tablets by mouth once.    Historical Provider, MD  lidocaine (LIDODERM) 5 % Place 2 patches onto the skin daily. Remove & Discard patch within 12 hours or as directed by MD: For pain managment 10/02/14   Sanjuana Kava, NP  nicotine (NICODERM CQ - DOSED IN MG/24 HOURS) 21 mg/24hr patch Place 1 patch (21 mg total) onto the skin daily. For nicotine addiction 10/02/14   Sanjuana Kava, NP  predniSONE 5 MG/5ML solution 40 mg daily x 3 days, 30 mg daily x 3 days, 20 mg daily x 3 days, 10 mg x 3 days then  stop 10/18/14   Pricilla LovelessScott Goldston, MD  QUEtiapine (SEROQUEL) 50 MG tablet Take 1 tablet (50 mg total) by mouth at bedtime. For insomnia/mood control 10/02/14   Sanjuana KavaAgnes I Nwoko, NP    Allergies  Bactrim; Keflex; Penicillins; and Amoxicillin  Triage Vitals: BP 124/79 mmHg  Pulse 100  Temp(Src) 98.6 F (37 C) (Oral)  Resp 18  Ht 5\' 4"  (1.626 m)  Wt 140 lb (63.504 kg)  BMI 24.02 kg/m2  SpO2 97%  Physical Exam  Constitutional: She is oriented to person, place, and time. She appears well-developed and well-nourished. No distress.  HENT:  Head: Normocephalic and atraumatic.  Eyes: EOM are normal.  Neck: Normal range of motion.  Cardiovascular: Normal rate, regular rhythm and normal heart sounds.    Pulmonary/Chest: Effort normal and breath sounds normal.  Abdominal: Soft. She exhibits no distension. There is no tenderness.  Musculoskeletal: Normal range of motion.  Neurological: She is alert and oriented to person, place, and time.  Skin: Skin is warm and dry.  Nursing note and vitals reviewed.   ED Course  Procedures   DIAGNOSTIC STUDIES: Oxygen Saturation is 97% on RA, normal by my interpretation.    COORDINATION OF CARE: 2:48 AM Discussed treatment plan which includes lab work and TTS consult with pt at bedside and pt agreed to plan.  Labs Reviewed  COMPREHENSIVE METABOLIC PANEL - Abnormal; Notable for the following:    AST 61 (*)    ALT 61 (*)    All other components within normal limits  ACETAMINOPHEN LEVEL - Abnormal; Notable for the following:    Acetaminophen (Tylenol), Serum <10 (*)    All other components within normal limits  CBC - Abnormal; Notable for the following:    WBC 10.8 (*)    Hemoglobin 11.8 (*)    All other components within normal limits  ETHANOL  SALICYLATE LEVEL  URINE RAPID DRUG SCREEN, HOSP PERFORMED  POC URINE PREG, ED    Imaging Review No results found.  I personally reviewed and evaluated these lab results as a part of my medical decision-making.   MDM   Final diagnoses:  None    2:51 AM Medically clear. Clonidine protocol. Much of her issue is substance abuse. Reports SI   I personally performed the services described in this documentation, which was scribed in my presence. The recorded information has been reviewed and is accurate.       Azalia BilisKevin Marcellus Pulliam, MD 12/01/14 [phone]

## 2014-12-01 NOTE — ED Notes (Signed)
Staffing office notified for pt.'s sitter , security called to wand pt. , purple scrubs given to pt. , personal belongings bagged and labelled at nurses station .

## 2014-12-01 NOTE — [facility] Counselor (Signed)
Radio broadcast [facility]Contact[facility] for [facility]. Completed tele-psych and consulted with Christen BameIjeoma, NP who sated that patient meets criteria for in patient psychiatric treatment. [facility] Randa EvensJoanne, RN and there is no bed available, pt will have to be referred out.  12/01/14 Referral packet sent to [facility], [place], [address], [address] [place], [place], and [facility]. Danaye Sobh K. Elder Davidian,LCAS-A, LPC-A, NCC  Counselor 12/01/2014 5:56 AM

## 2014-12-01 NOTE — ED Notes (Signed)
Pt offered snack at this time. 

## 2014-12-01 NOTE — ED Notes (Addendum)
Pt's $1 bill was placed in a "urine cup" and put in a labeled personal belongings bag. Pt wanted to make sure that she got her $1 bill back.

## 2014-12-01 NOTE — ED Notes (Addendum)
Pt. reports suicidal ideation , plans to stab herself , denies hallucinations . Pt.also stated Heroin IV use this evening.

## 2014-12-01 NOTE — ED Notes (Signed)
Pt resting with eyes closed, nad noted, sitter at bedside.
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
## 2014-12-01 NOTE — ED Notes (Signed)
Meal tray given 

## 2014-12-01 NOTE — ED Notes (Signed)
Pt placed in wine colored paper scrubs and brown socks.

## 2014-12-01 NOTE — ED Notes (Signed)
Pt. wanded by security at triage .  

## 2014-12-02 ENCOUNTER — Encounter (HOSPITAL_COMMUNITY): Payer: Self-pay | Admitting: *Deleted

## 2014-12-02 ENCOUNTER — Inpatient Hospital Stay (HOSPITAL_COMMUNITY)
Admission: AD | Admit: 2014-12-02 | Discharge: 2014-12-08 | DRG: 885 | Disposition: A | Discharge status: Home / Self Care | Payer: Medicaid - Out of State | Source: Intra-hospital | Attending: Psychiatry | Admitting: Psychiatry

## 2014-12-02 DIAGNOSIS — F332 Major depressive disorder, recurrent severe without psychotic features: Principal | ICD-10-CM | POA: Diagnosis present

## 2014-12-02 DIAGNOSIS — Z590 Homelessness: Secondary | ICD-10-CM

## 2014-12-02 DIAGNOSIS — F1124 Opioid dependence with opioid-induced mood disorder: Secondary | ICD-10-CM | POA: Diagnosis not present

## 2014-12-02 DIAGNOSIS — F319 Bipolar disorder, unspecified: Secondary | ICD-10-CM | POA: Diagnosis present

## 2014-12-02 DIAGNOSIS — F1420 Cocaine dependence, uncomplicated: Secondary | ICD-10-CM | POA: Diagnosis present

## 2014-12-02 DIAGNOSIS — R45851 Suicidal ideations: Secondary | ICD-10-CM | POA: Diagnosis present

## 2014-12-02 DIAGNOSIS — Z8659 Personal history of other mental and behavioral disorders: Secondary | ICD-10-CM

## 2014-12-02 DIAGNOSIS — F17210 Nicotine dependence, cigarettes, uncomplicated: Secondary | ICD-10-CM | POA: Diagnosis present

## 2014-12-02 DIAGNOSIS — F1120 Opioid dependence, uncomplicated: Secondary | ICD-10-CM | POA: Diagnosis present

## 2014-12-02 DIAGNOSIS — F329 Major depressive disorder, single episode, unspecified: Secondary | ICD-10-CM | POA: Diagnosis present

## 2014-12-02 MED ORDER — ACETAMINOPHEN 325 MG PO TABS
650.0000 mg | ORAL_TABLET | Freq: Four times a day (QID) | ORAL | Status: DC | PRN
Start: 2014-12-02 — End: 2014-12-08
  Administered 2014-12-03 – 2014-12-05 (×3): 650 mg via ORAL
  Filled 2014-12-02 (×4): qty 2

## 2014-12-02 MED ORDER — MAGNESIUM HYDROXIDE 400 MG/5ML PO SUSP: 30.0000 mL | Freq: Every day | ORAL | Status: DC | PRN

## 2014-12-02 MED ORDER — CLONIDINE HCL 0.1 MG PO TABS: 0.1000 mg | ORAL_TABLET | Freq: Every day | ORAL | Status: DC

## 2014-12-02 MED ORDER — QUETIAPINE FUMARATE 50 MG PO TABS
50.0000 mg | ORAL_TABLET | Freq: Every day | ORAL | Status: DC
  Administered 2014-12-02 – 2014-12-03 (×2): 50 mg via ORAL
  Filled 2014-12-02 (×6): qty 1

## 2014-12-02 MED ORDER — CLONIDINE HCL 0.1 MG PO TABS
0.1000 mg | ORAL_TABLET | Freq: Four times a day (QID) | ORAL | Status: AC
  Administered 2014-12-02 – 2014-12-04 (×7): 0.1 mg via ORAL
  Filled 2014-12-02 (×12): qty 1

## 2014-12-02 MED ORDER — HYDROXYZINE HCL 25 MG PO TABS
25.0000 mg | ORAL_TABLET | Freq: Four times a day (QID) | ORAL | Status: AC | PRN
  Administered 2014-12-02 – 2014-12-07 (×9): 25 mg via ORAL
  Filled 2014-12-02 (×9): qty 1

## 2014-12-02 MED ORDER — NAPROXEN 500 MG PO TABS
500.0000 mg | ORAL_TABLET | Freq: Two times a day (BID) | ORAL | Status: AC | PRN
  Administered 2014-12-02 – 2014-12-07 (×8): 500 mg via ORAL
  Filled 2014-12-02 (×8): qty 1

## 2014-12-02 MED ORDER — DICYCLOMINE HCL 20 MG PO TABS
20.0000 mg | ORAL_TABLET | Freq: Four times a day (QID) | ORAL | Status: AC | PRN
  Administered 2014-12-03 – 2014-12-05 (×6): 20 mg via ORAL
  Filled 2014-12-02 (×6): qty 1

## 2014-12-02 MED ORDER — NICOTINE 21 MG/24HR TD PT24
21.0000 mg | MEDICATED_PATCH | Freq: Every day | TRANSDERMAL | Status: DC
  Administered 2014-12-03 – 2014-12-08 (×6): 21 mg via TRANSDERMAL
  Filled 2014-12-02 (×8): qty 1

## 2014-12-02 MED ORDER — CLONIDINE HCL 0.1 MG PO TABS
0.1000 mg | ORAL_TABLET | Freq: Every day | ORAL | Status: DC
  Administered 2014-12-08: 0.1 mg via ORAL
  Filled 2014-12-02 (×2): qty 1

## 2014-12-02 MED ORDER — CLONIDINE HCL 0.1 MG PO TABS
0.1000 mg | ORAL_TABLET | Freq: Four times a day (QID) | ORAL | Status: DC
  Administered 2014-12-02: 0.1 mg via ORAL
  Filled 2014-12-02 (×2): qty 1

## 2014-12-02 MED ORDER — CLONIDINE HCL 0.1 MG PO TABS
0.1000 mg | ORAL_TABLET | ORAL | Status: DC
Start: 2014-12-03 — End: 2014-12-02

## 2014-12-02 MED ORDER — CLONIDINE HCL 0.1 MG PO TABS
0.1000 mg | ORAL_TABLET | ORAL | Status: AC
  Administered 2014-12-05 – 2014-12-07 (×4): 0.1 mg via ORAL
  Filled 2014-12-02 (×4): qty 1

## 2014-12-02 MED ORDER — METHOCARBAMOL 500 MG PO TABS
500.0000 mg | ORAL_TABLET | Freq: Three times a day (TID) | ORAL | Status: AC | PRN
  Administered 2014-12-02 – 2014-12-07 (×10): 500 mg via ORAL
  Filled 2014-12-02 (×10): qty 1

## 2014-12-02 MED ORDER — LOPERAMIDE HCL 2 MG PO CAPS: 2.0000 mg | ORAL_CAPSULE | ORAL | Status: AC | PRN

## 2014-12-02 MED ORDER — ONDANSETRON 4 MG PO TBDP
4.0000 mg | ORAL_TABLET | Freq: Four times a day (QID) | ORAL | Status: AC | PRN
  Administered 2014-12-03: 4 mg via ORAL
  Filled 2014-12-02: qty 1

## 2014-12-02 MED ORDER — ALUM & MAG HYDROXIDE-SIMETH 200-200-20 MG/5ML PO SUSP: 30.0000 mL | ORAL | Status: DC | PRN

## 2014-12-02 MED ORDER — TRAZODONE HCL 50 MG PO TABS: 50.0000 mg | ORAL_TABLET | Freq: Every evening | ORAL | Status: DC | PRN

## 2014-12-02 MED ORDER — DOXYCYCLINE HYCLATE 100 MG PO TABS
100.0000 mg | ORAL_TABLET | Freq: Two times a day (BID) | ORAL | Status: DC
  Administered 2014-12-02 – 2014-12-08 (×12): 100 mg via ORAL
  Filled 2014-12-02 (×5): qty 1
  Filled 2014-12-02: qty 3
  Filled 2014-12-02 (×7): qty 1
  Filled 2014-12-02: qty 3
  Filled 2014-12-02 (×4): qty 1

## 2014-12-02 NOTE — Progress Notes (Addendum)
[facility] per Victorino DikeJennifer, referral under review. [facility] - per [place], beds open on Monday, re-fax referral today. [facility] - left voicemail.  At capacity: [facility] - per [place], no adult beds today, only child/adolescent [facility] - per [place] Pop[place]igi  [facility], [facility] and [facility] don't accept [address] (or out of [place]).  CSW will continue to seek placement.  [place] Abtsatia Mercedies Ganesh, LCSWA Disposition staff 12/02/2014 2:07 PM

## 2014-12-02 NOTE — ED Notes (Signed)
Pt noted to be wearing hospital gown.

## 2014-12-02 NOTE — Progress Notes (Signed)
29 year old female pt admitted on voluntary basis. Pt reports on-going depression, substance abuse issues and SI but able to contract for safety on the unit. Pt reports using heroin daily and has various track marks to both arms as well as boils on hip and buttocks. Pt reports that she was here 2 months ago and was unable to follow-up with services. Pt also reports that she has been off her medications the past month as well. Pt reports that she would like to go to rehab from here. Pt was oriented to the unit and safety maintained.

## 2014-12-02 NOTE — Tx Team (Signed)
Initial Interdisciplinary Treatment Plan   PATIENT STRESSORS: Medication change or noncompliance Substance abuse   PATIENT STRENGTHS: Ability for insight Active sense of humor Average or above average intelligence Capable of independent living General fund of knowledge Motivation for treatment/growth   PROBLEM LIST: Problem List/Patient Goals Date to be addressed Date deferred Reason deferred Estimated date of resolution  Depression 12/02/14     Substance Abuse 12/02/14     Suicidal thoughts 12/02/14     "I want to work on sobriety and my mental stability" 12/02/14                                    DISCHARGE CRITERIA:  Ability to meet basic life and health needs Improved stabilization in mood, thinking, and/or behavior Verbal commitment to aftercare and medication compliance Withdrawal symptoms are absent or subacute and managed without 24-hour nursing intervention  PRELIMINARY DISCHARGE PLAN: Attend aftercare/continuing care group Placement in alternative living arrangements  PATIENT/FAMIILY INVOLVEMENT: This treatment plan has been presented to and reviewed with the patient, Charmian MuffKerri Haste, and/or family member, .  The patient and family have been given the opportunity to ask questions and make suggestions.  Ramla Hase, [place] 12/02/2014, 11:10 PM

## 2014-12-02 NOTE — Progress Notes (Signed)
[facility]. Rosey BathKelly Karee Christopherson, RN

## 2014-12-02 NOTE — Progress Notes (Signed)
Psychoeducational Group Note  Date:  12/02/2014 Time:  2311  Group Topic/Focus:  Wrap-Up Group:   The focus of this group is to help patients review their daily goal of treatment and discuss progress on daily workbooks.  Participation Level: Did Not Attend  Participation Quality:  Not Applicable  Affect:  Not Applicable  Cognitive:  Not Applicable  Insight:  Not Applicable  Engagement in Group: Not Applicable  Additional Comments:  The patient did not attend group since she was admitted after the group concluded.   Kashmere Daywalt S 12/02/2014, 11:10 PM

## 2014-12-02 NOTE — Consult Note (Signed)
Received a [facility] [place] regarding [facility] abscess to the patient's left hip. Upon assessment, patient has golf-ball sized abscess to her left hip. The abscess has an erythematous border with a fluctuant center. There is no drainage present. The patient states the abscess has been present for three days and states this is common when she uses drugs. She denies injecting any drugs into this area. There are also other skin infections in various stages of healing to her buttocks.   She reports allergies to Bactrim, Keflex, and Penicillin.  Plan: Start Doxycycline 100 mg PO BID x 14 days  Alberteen SamFran Kazumi Lachney, FNP-BC Behavioral Health Services 12/02/2014 10:08 PM

## 2014-12-02 NOTE — ED Notes (Signed)
Per Idalia NeedlePaige, [facility], pt accepted to 407-1 - [place] - may arrive after 1930.

## 2014-12-02 NOTE — ED Notes (Signed)
Pharm Tech in w/pt. 

## 2014-12-02 NOTE — ED Notes (Signed)
Pt changing into paper scrubs from gown. Aware [facility] and is agreement w/plan.

## 2014-12-02 NOTE — [facility] Counselor (Signed)
Writer saw pt for reassessment. Pt was woken up by RN Kriste BasqueBecky for TA. Pt then began eating lunch as Clinical research associatewriter asked questions. She endorses SI with plan to "cut my throat". Pt denies HI. She denies [facility] and no delusions noted. Pt reports two prior suicide attempts.   Brenda Stafford, [place] Therapeutic Triage Specialist

## 2014-12-02 NOTE — ED Notes (Signed)
Attempted report 

## 2014-12-02 NOTE — ED Notes (Signed)
Patient was given a snack and drink. Regular diet order taken for lunch. 

## 2014-12-03 DIAGNOSIS — F1420 Cocaine dependence, uncomplicated: Secondary | ICD-10-CM

## 2014-12-03 DIAGNOSIS — F332 Major depressive disorder, recurrent severe without psychotic features: Principal | ICD-10-CM

## 2014-12-03 DIAGNOSIS — R45851 Suicidal ideations: Secondary | ICD-10-CM

## 2014-12-03 MED ORDER — CITALOPRAM HYDROBROMIDE 40 MG PO TABS
40.0000 mg | ORAL_TABLET | Freq: Every day | ORAL | Status: DC
  Administered 2014-12-04 – 2014-12-08 (×5): 40 mg via ORAL
  Filled 2014-12-03: qty 7
  Filled 2014-12-03 (×6): qty 1

## 2014-12-03 NOTE — Progress Notes (Signed)
Patient ID: Brenda Stafford, female   DOB: [birth year], 28 y.o.   MRN: [id number]  Patient sleeping at this time, will not get up for medications.
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
## 2014-12-03 NOTE — BHH Suicide Risk Assessment (Signed)
[facility]   Nursing information obtained from:    Demographic factors:    Current Mental Status:    Loss Factors:    Historical Factors:    Risk Reduction Factors:    Total Time spent with patient: 30 minutes Principal Problem: Major depressive disorder, recurrent, severe without psychotic features (HCC) Diagnosis:   Patient Active Problem List   Diagnosis Date Noted  . Major depressive disorder, recurrent, severe without psychotic features (HCC) [F33.2]   . H/O borderline personality disorder [Z86.59] 09/25/2014  . Opioid use disorder, moderate, dependence (HCC) [F11.20] 09/25/2014  . Cocaine use disorder, moderate, dependence (HCC) [F14.20]      Continued Clinical Symptoms:  Alcohol Use Disorder Identification Test Final Score (AUDIT): 1 The "Alcohol Use Disorders Identification Test", Guidelines for Use in Primary Care, Second Edition.  World Science writerHealth Organization [facility](WHO). Score between 0-7:  no or low risk or alcohol related problems. Score between 8-15:  moderate risk of alcohol related problems. Score between 16-19:  high risk of alcohol related problems. Score 20 or above:  warrants further diagnostic evaluation for alcohol dependence and treatment.   CLINICAL FACTORS:   Alcohol/Substance Abuse/Dependencies Personality Disorders:   Cluster B Unstable or Poor Therapeutic Relationship Previous Psychiatric Diagnoses and Treatments   Musculoskeletal: Strength & Muscle Tone: within normal limits Gait & Station: seen in bed Patient leans: N/A  Psychiatric Specialty Exam: Physical Exam  Review of Systems  Musculoskeletal: Positive for back pain.  Skin:       Has an abcess on her hips- appears to be inflammed , tender  Psychiatric/Behavioral: Positive for depression and substance abuse. The patient is nervous/anxious and has insomnia.   All other systems reviewed and are negative.   Blood pressure 122/70, pulse 75, temperature 98 F (36.7 C),  temperature source Oral, resp. rate 18, height 5\' 4"  (1.626 m), weight 61.689 kg (136 lb).Body mass index is 23.33 kg/(m^2).  General Appearance: Disheveled  Eye SolicitorContact::  None  Speech:  Slow and Slurred  Volume:  Decreased  Mood:  Anxious, Depressed and Irritable  Affect:  Constricted  Thought Process:  Intact  Orientation:  Other:  person , place  Thought Content:  Rumination  Suicidal Thoughts:  No  Homicidal Thoughts:  No  Memory:  Immediate;   Poor Recent;   Poor Remote;   Poor  Judgement:  Impaired  Insight:  Shallow  Psychomotor Activity:  Restlessness  Concentration:  Poor  Recall:  Poor  Fund of Knowledge:Fair  Language: Fair  Akathisia:  No  Handed:  Right  AIMS (if indicated):     Assets:  Desire for Improvement  Sleep:  Number of Hours: 6.5  Cognition: WNL  ADL's:  Intact     COGNITIVE FEATURES THAT CONTRIBUTE TO RISK:  Closed-mindedness, Polarized thinking and Thought constriction (tunnel vision)    SUICIDE RISK:   Moderate:  Frequent suicidal ideation with limited intensity, and duration, some specificity in terms of plans, no associated intent, good self-control, limited dysphoria/symptomatology, some risk factors present, and identifiable protective factors, including available and accessible social support.  PLAN OF CARE:Patient will benefit from inpatient treatment and stabilization.  Estimated length of stay is 5-7 days.  Reviewed past medical records,treatment plan.  Patient at this time is not very cooperative , limited participation in milieu. Will continue medications as per MAR. Will continue COWS .CLonidine protocol. Continue Doxycycline 100 mg po bid for skin abcess. Will continue to monitor vitals ,medication compliance and treatment side effects  while patient is here.  Will monitor for medical issues as well as call consult as needed.  Reviewed labs urine pregnancy - negative, UDS- cocaine, opioid, cmp - AST/ALT elevated - likely due to  substance abuse,will discuss need for further labs once pt is more cooperative. CSW will start working on disposition.  Patient to participate in therapeutic milieu .       Medical Decision Making:  Review of Psycho-Social Stressors (1), Review or order clinical lab tests (1), Decision to obtain old records (1), Review and summation of old records (2), Established Problem, Worsening (2), Review of Last Therapy Session (1) and Review of New Medication or Change in Dosage (2)  I certify that inpatient services furnished can reasonably be expected to improve the patient's condition.   Brenda Monterrosa MD 12/03/2014, 11:31 AM

## 2014-12-03 NOTE — [facility] Suicide Risk Assessment (Signed)
[facility] Family/Significant Other Suicide Prevention Education  Suicide Prevention Education:   Patient Refusal for Family/Significant Other Suicide Prevention Education: The patient has refused to provide written consent for family/significant other to be provided Family/Significant Other Suicide Prevention Education during admission and/or prior to discharge.  Physician notified.  CSW provided suicide prevention information with patient.    The suicide prevention education provided includes the following:  Suicide risk factors  Suicide prevention and interventions  National Suicide Hotline telephone number  [facility] assessment telephone number  [facility] Emergency Assistance 911  [facility] and/or Residential Mobile Crisis Unit telephone number   Reyes IvanChelsea Horton, KentuckyLCSW 12/03/2014 1:11 PM

## 2014-12-03 NOTE — BHH Group Notes (Signed)
BHH Group Notes:  (Nursing/MHT/Case Management/Adjunct)  Date:  12/03/2014  Time:  12:31 PM  Type of Therapy:  Psychoeducational Skills  Participation Level:  Did Not Attend  Participation Quality:  Did Not Attend  Affect:  Did Not Attend  Cognitive:  Did Not Attend  Insight:  None  Engagement in Group:  Did Not Attend  Modes of Intervention:  Did Not Attend  Summary of Progress/Problems: Pt did not attend patient self inventory group.   Jacquelyne BalintForrest, Tahlor Berenguer Shanta 12/03/2014, 12:31 PM

## 2014-12-03 NOTE — BHH Group Notes (Signed)
B[facility] Group Notes: (Clinical Social Work)   12/03/2014      Type of Therapy:  Group Therapy   Participation Level:  Did Not Attend despite MHT prompting   Ambrose MantleMareida Grossman-Orr, LCSW 12/03/2014, 4:18 PM

## 2014-12-03 NOTE — Progress Notes (Signed)
Patient ID: Brenda Stafford, female   DOB: 1986-01-04, 29 y.o.   MRN: [id number]  DAR: Pt. Denies HI and A/V Hallucinations. She reports SI but is able to contract for safety. Patient continues to report pain that is generalized and moderate withdrawal symptoms. PRN medications administered to patient to aid with withdrawal symptoms. Support and encouragement provided to the patient. Scheduled medications administered to patient per physician's orders. Patient is guarded in the morning however is seen more in the milieu in the late afternoon and evening. She was able to take a shower and reported that was helpful. Q15 minute checks are maintained for safety.

## 2014-12-04 MED ORDER — QUETIAPINE FUMARATE 50 MG PO TABS
75.0000 mg | ORAL_TABLET | Freq: Every day | ORAL | Status: DC
  Administered 2014-12-04: 75 mg via ORAL
  Filled 2014-12-04 (×5): qty 1

## 2014-12-04 MED ORDER — CLONAZEPAM 0.5 MG PO TABS
0.5000 mg | ORAL_TABLET | Freq: Two times a day (BID) | ORAL | Status: DC
  Administered 2014-12-04 – 2014-12-07 (×7): 0.5 mg via ORAL
  Filled 2014-12-04 (×8): qty 1

## 2014-12-04 MED ORDER — GABAPENTIN 100 MG PO CAPS
100.0000 mg | ORAL_CAPSULE | Freq: Three times a day (TID) | ORAL | Status: DC
  Administered 2014-12-04 – 2014-12-05 (×4): 100 mg via ORAL
  Filled 2014-12-04 (×11): qty 1

## 2014-12-04 NOTE — Plan of Care (Signed)
Problem: Diagnosis: Increased Risk For Suicide Attempt Goal: STG-Patient Will Comply With Medication Regime Outcome: Progressing Patient is compliant with scheduled medications.     

## 2014-12-04 NOTE — Progress Notes (Signed)
Writer assessed pt boil to Lt buttocks. No drainage or pus noted. Boil is reddened, crusted and appears to have ruptured. Pt denies pain to site and stated that she have notice a decrease in size. Boil has not expanded outside of the outlined area.

## 2014-12-04 NOTE — BHH Group Notes (Signed)
BHH LCSW Group Therapy  12/04/2014 1:15pm  Type of Therapy:  Group Therapy vercoming Obstacles  Pt did not attend, declined invitation.   Chad CordialLauren Carter, LCSWA 12/04/2014 4:08 PM

## 2014-12-04 NOTE — Progress Notes (Signed)
Writer has observed patient mostly in her room lying down and when she become uncomfortable she will come to request medications for her withdrawal symptoms. She did spend a short period of time in the dayroom this evening. She attended group and participated. She reports passive si and verbally contracts for safety, denies hi/a/v hallucinations. Writer informed her of prns available for withdrawal symptoms and was made aware that these medications will help ease her symptoms but will not eliminate her symptoms. Support and encouragement given, safety maintained on unit with 15 min checks.

## 2014-12-04 NOTE — Progress Notes (Signed)
B[facility] Group Notes:  (Nursing/MHT/Case Management/Adjunct)  Date:  12/04/2014  Time:  1:28 AM  Type of Therapy:  Psychoeducational Skills  Participation Level:  Minimal  Participation Quality:  Attentive  Affect:  Flat  Cognitive:  Lacking  Insight:  Lacking  Engagement in Group:  Lacking  Modes of Intervention:  Education  Summary of Progress/Problems: Patient admits to having slept for much of the day. As for the theme of the day, her support system will be comprised of her parents and children.   Brenda Stafford S 12/04/2014, 1:28 AM

## 2014-12-04 NOTE — Plan of Care (Signed)
Problem: Diagnosis: Increased Risk For Suicide Attempt Goal: STG-Patient Will Attend All Groups On The Unit Outcome: Progressing Patient attended evening wrap up group.     

## 2014-12-04 NOTE — Progress Notes (Signed)
D    Pt is pleasant on approach and cooperative   She reports feeling much better than she did yesterday and said she is glad she is back on her medications   Pt requests medication frequently and as much as she can have as often as she can have it   Her interaction with others is appropriate but minimal A   Verbal support given   Medications administered and effectiveness monitored   Q 15 min checks   Educated on medications and alternative ways to cope  R   Pt is safe and receptive to verbal support

## 2014-12-04 NOTE — Progress Notes (Signed)
D: Pt irritable on approach, yelling and cursing at writing because she didn't want to come to med window this morning for scheduled meds. Per pt, all the other nurses bring her meds to her room. Pt then came to the med window apologizing to Clinical research associatewriter and stated that she feels awful because she is withdrawing from heroin. Pt reports having stomach cramps, muscle spasms, irritability, anxiety and sweats. Pt reports passive suicidal thoughts and verbally contracts not to harm self. Pt given prn meds for withdrawal symptoms. Pt requesting to be started on Gabapentin 300 mg TID and Buspar 15 mg TID. Dr. Jama Flavorsobos made aware.   A: Medications administered as ordered per MD. Verbal support given. Pt encouraged to attend groups. Pt COWS completed. 15 minute checks performed for safety.  R: Pt safety maintained. Pt refused to fill out self inventory sheet.

## 2014-12-04 NOTE — Progress Notes (Signed)
[facility] MD Progress Note  12/04/2014 3:01 PM Brenda Stafford  MRN:  [id number] Subjective:   Patient reports ongoing anxiety which she attributes to withdrawal. Reports some ongoing symptoms of opiate WDL, to include aches , some abdominal cramps- although states these are now subsiding. States yesterday was nauseous and vomited, but today this is improved. Diarrhea also improving .  States she is experiencing severe cravings for opiates at present . Objective : I have discussed case with treatment team and have met with patient. As  Noted, patient reports partial improvement compared to yesterday, with less severe symptoms of opiate WDL, but continues to report significant anxiety, aches, cramping. Does present vaguely restless , fidgety, which she attributes to anxiety and residual WDL. Intermittently irritable and loud on unit, which she is apologetic about and attributing to the anxiety and discomfort of WDL. States she is highly motivated in maintaining sobriety after discharge , and states her parents have offered to let her live with them for a period of time after being discharged . Patient has history of IVDA- states she was tested negative for HIV about two years ago- expresses interest in being retested for HIV and for viral hepatitis . Marland Kitchen  Principal Problem: Major depressive disorder, recurrent, severe without psychotic features ([place]) Diagnosis:   Patient Active Problem List   Diagnosis Date Noted  . Major depressive disorder, recurrent, severe without psychotic features ([place]) [F33.2]   . H/O borderline personality disorder [Z86.59] 09/25/2014  . Opioid use disorder, moderate, dependence ([place]) [F11.20] 09/25/2014  . Cocaine use disorder, moderate, dependence ([place]) [F14.20]    Total Time spent with patient: 25 minutes     Past Medical History:  Past Medical History  Diagnosis Date  . Endocarditis   . Osteomyelitis ([place])   . Sciatica   . Bipolar 1 disorder ([place])   . Personality  disorder   . Depression   . Heroin addiction ([place])   . Anxiety   . Cocaine addiction ([place])   . Suicidal ideation    History reviewed. No pertinent past surgical history. Family History: History reviewed. No pertinent family history.  Social History:  History  Alcohol Use No     History  Drug Use  . Yes  . Special: Heroin, Cocaine    Comment: heroin    Social History   Social History  . Marital Status: Single    Spouse Name: N/A  . Number of Children: N/A  . Years of Education: N/A   Social History Main Topics  . Smoking status: Current Every Day Smoker -- 1.00 packs/day    Types: Cigarettes  . Smokeless tobacco: None  . Alcohol Use: No  . Drug Use: Yes    Special: Heroin, Cocaine     Comment: heroin  . Sexual Activity: Yes   Other Topics Concern  . None   Social History Narrative   Additional Social History:   Sleep: Fair  Appetite:  Fair  Current Medications: Current Facility-Administered Medications  Medication Dose Route Frequency Provider Last Rate Last Dose  . acetaminophen (TYLENOL) tablet 650 mg  650 mg Oral Q6H PRN Lurena Nida, NP   650 mg at 12/03/14 2159  . alum & mag hydroxide-simeth (MAALOX/MYLANTA) 200-200-20 MG/5ML suspension 30 mL  30 mL Oral Q4H PRN Lurena Nida, NP      . citalopram (CELEXA) tablet 40 mg  40 mg Oral Daily Ursula Alert, MD   40 mg at 12/04/14 0938  . cloNIDine (CATAPRES) tablet 0.1 mg  0.1 mg Oral QID Lurena Nida, NP   0.1 mg at 12/04/14 9811   Followed by  . [START ON 12/05/2014] cloNIDine (CATAPRES) tablet 0.1 mg  0.1 mg Oral BH-qamhs Lurena Nida, NP       Followed by  . [START ON 12/08/2014] cloNIDine (CATAPRES) tablet 0.1 mg  0.1 mg Oral QAC breakfast Lurena Nida, NP      . dicyclomine (BENTYL) tablet 20 mg  20 mg Oral Q6H PRN Lurena Nida, NP   20 mg at 12/04/14 9147  . doxycycline (VIBRA-TABS) tablet 100 mg  100 mg Oral Q12H Lurena Nida, NP   100 mg at 12/04/14 8295  . hydrOXYzine (ATARAX/VISTARIL)  tablet 25 mg  25 mg Oral Q6H PRN Lurena Nida, NP   25 mg at 12/04/14 6213  . loperamide (IMODIUM) capsule 2-4 mg  2-4 mg Oral PRN Lurena Nida, NP      . magnesium hydroxide (MILK OF MAGNESIA) suspension 30 mL  30 mL Oral Daily PRN Lurena Nida, NP      . methocarbamol (ROBAXIN) tablet 500 mg  500 mg Oral Q8H PRN Lurena Nida, NP   500 mg at 12/04/14 0865  . naproxen (NAPROSYN) tablet 500 mg  500 mg Oral BID PRN Lurena Nida, NP   500 mg at 12/03/14 1943  . nicotine (NICODERM CQ - dosed in mg/24 hours) patch 21 mg  21 mg Transdermal Daily Jenne Campus, MD   21 mg at 12/04/14 0940  . ondansetron (ZOFRAN-ODT) disintegrating tablet 4 mg  4 mg Oral Q6H PRN Lurena Nida, NP   4 mg at 12/03/14 1643  . QUEtiapine (SEROQUEL) tablet 50 mg  50 mg Oral QHS Lurena Nida, NP   50 mg at 12/03/14 2104    Lab Results: No results found for this or any previous visit (from the past 48 hour(s)).  Physical Findings: AIMS: Facial and Oral Movements Muscles of Facial Expression: None, normal Lips and Perioral Area: None, normal Jaw: None, normal Tongue: None, normal,Extremity Movements Upper (arms, wrists, hands, fingers): None, normal Lower (legs, knees, ankles, toes): None, normal, Trunk Movements Neck, shoulders, hips: None, normal, Overall Severity Severity of abnormal movements (highest score from questions above): None, normal Incapacitation due to abnormal movements: None, normal Patient's awareness of abnormal movements (rate only patient's report): No Awareness, Dental Status Current problems with teeth and/or dentures?: No Does patient usually wear dentures?: No  CIWA:    COWS:  COWS Total Score: 10  Musculoskeletal: Strength & Muscle Tone: within normal limits Gait & Station: normal Patient leans: N/A  Psychiatric Specialty Exam: ROS (+) nausea, (+) cramps , (+) restlessness , denies vomiting or diarrhea today  Blood pressure 123/76, pulse 84, temperature 98.7 F (37.1 C),  temperature source Oral, resp. rate 16, height 5' 4"  (1.626 m), weight 136 lb (61.689 kg).Body mass index is 23.33 kg/(m^2).  General Appearance: Fairly Groomed  Engineer, water::  Good  Speech:  Normal Rate  Volume:  Normal  Mood:  Anxious  Affect:  Appropriate and anxious   Thought Process:  Goal Directed and Linear  Orientation:  Full (Time, Place, and Person)  Thought Content:  denies hallucinations, no delusions   Suicidal Thoughts:  No- denies any suicidal ideations at present , denies any self injurious ideations  Homicidal Thoughts:  No  Memory:  recent and remote grossly intact   Judgement:  Fair  Insight:  Present  Psychomotor Activity:  Restlessness  Concentration:  Good  Recall:  Good  Fund of Knowledge:Good  Language: Good  Akathisia:  Negative  Handed:  Right  AIMS (if indicated):     Assets:  Desire for Improvement Resilience  ADL's:  Fair   Cognition: WNL  Sleep:  Number of Hours: 5.25  Assessment - patient is presenting with ongoing anxiety, restlessness, emotional lability related to Opiate WDL, although states that symptoms such as vomiting, diarrhea, pains /aches are now subsiding compared to yesterday. At present  Does remain vaguely agitated, restless, uncomfortable, although vitals are stable. Presents dysphoric, but denies any suicidal ideations and contracts for safety on unit .  Treatment Plan Summary: Daily contact with patient to assess and evaluate symptoms and progress in treatment, Medication management, Plan inpatient admission  and medications as below  Continue to encourage group, milieu participation to work on coping skills and symptom reduction Continue to encourage relapse prevention efforts  Start Klonopin 0.5 mgrs BID to address significant withdrawal related anxiety. Plan is for BZD to be short course, and to be D/C d prior to discharge- patient expresses understanding. Continue Clonidine taper to address opiate WDL symptoms Continue Celexa  40 mgrsd QDAY for depression, anxiety Continue Seroquel at 75 mgrs QHS for night time anxiety, restlessness and insomnia As per patient request, will check for  Hep B, C and HIV  Brenda Stafford 12/04/2014, 3:01 PM

## 2014-12-04 NOTE — [facility] Group Notes (Signed)
[facility]  12/04/2014 8:45 AM  Pt did not attend, declined invitation.  Chad CordialLauren Carter, LCSWA 12/04/2014 10:13 AM

## 2014-12-04 NOTE — Progress Notes (Signed)
Adult Psychoeducational Group Note  Date:  12/04/2014 Time:  9:28 PM  Group Topic/Focus:  Wrap-Up Group:   The focus of this group is to help patients review their daily goal of treatment and discuss progress on daily workbooks.  Participation Level:  Active  Participation Quality:  Appropriate and Attentive  Affect:  Appropriate  Cognitive:  Appropriate  Insight: Appropriate  Engagement in Group:  Engaged  Modes of Intervention:  Discussion  Additional Comments:  Pt rated her day a 6/10. Her goal for today was to see the doctor and her medications straight, which she accomplished. Pt stated one positive thing is that she is to be discharged Friday and she will get to see her children, which she initially thought was not going to happen.  Caswell CorwinOwen, Swayzie Choate C 12/04/2014, 9:28 PM

## 2014-12-05 MED ORDER — QUETIAPINE FUMARATE 50 MG PO TABS
50.0000 mg | ORAL_TABLET | Freq: Every day | ORAL | Status: DC
  Filled 2014-12-05: qty 1

## 2014-12-05 MED ORDER — GABAPENTIN 100 MG PO CAPS
200.0000 mg | ORAL_CAPSULE | Freq: Two times a day (BID) | ORAL | Status: DC
  Administered 2014-12-05 – 2014-12-06 (×2): 200 mg via ORAL
  Filled 2014-12-05 (×4): qty 2

## 2014-12-05 MED ORDER — QUETIAPINE FUMARATE 50 MG PO TABS
75.0000 mg | ORAL_TABLET | Freq: Every day | ORAL | Status: DC
  Administered 2014-12-05 – 2014-12-06 (×2): 75 mg via ORAL
  Filled 2014-12-05 (×3): qty 1

## 2014-12-05 NOTE — BHH Group Notes (Signed)
[facility] LCSW Group Therapy 12/05/2014 1:15 PM  Type of Therapy: Group Therapy- Feelings about Diagnosis  Pt attended group briefly but left early on and did not return.  Chad CordialLauren Carter, LCSWA 12/05/2014 3:52 PM

## 2014-12-05 NOTE — Progress Notes (Signed)
Patient ID: Brenda Stafford, female   DOB: 12-Oct-1985, 29 y.o.   MRN: [id number] [facility] MD Progress Note  12/05/2014 4:47 PM Brenda Stafford  MRN:  [id number] Subjective:     She states she is better compared to admission- states her symptoms of opiate withdrawal have subsided partially compared to prior, and states " I was finally able to sleep well last night". States she still has muscle cramps and aches, but less than before / No diarrhea , no vomiting. Denies medication  Side effects. Patient states she does have ongoing lower back pain and anxiety, which she attributes to needing Neurontin dose to be increased .  Objective : I have discussed case with treatment team and have met with patient. Patient presenting with some improvement compared to yesterday. Less severely anxious , less restless, and reporting overall reduction in severity of her opiate WDL symptoms.  She is tolerating medications well. At this time is future oriented, and states she has decided to return to [place] after discharge in order to live with her father, and plans to consider going to a suboxone clinic for maintenance . Some group participation, no disruptive behaviors on unit. Earlier today, as reviewed with nursing staff, was noted to be sleeping - at this time she is fully alert and attentive. States " it is because I finally felt better, so my body just crashed and I needed to catch up with sleep".  .  Principal Problem: Major depressive disorder, recurrent, severe without psychotic features ([place]) Diagnosis:   Patient Active Problem List   Diagnosis Date Noted  . Major depressive disorder, recurrent, severe without psychotic features ([place]) [F33.2]   . H/O borderline personality disorder [Z86.59] 09/25/2014  . Opioid use disorder, moderate, dependence ([place]) [F11.20] 09/25/2014  . Cocaine use disorder, moderate, dependence ([place]) [F14.20]    Total Time spent with patient: 25 minutes     Past Medical History:   Past Medical History  Diagnosis Date  . Endocarditis   . Osteomyelitis ([place])   . Sciatica   . Bipolar 1 disorder ([place])   . Personality disorder   . Depression   . 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 Anxiety   . Cocaine addiction ([place])   . Suicidal ideation    History reviewed. No pertinent past surgical history. Family History: History reviewed. No pertinent family history.  Social History:  History  Alcohol Use No     History  Drug Use  . Yes  . Special: Heroin, Cocaine    Comment: heroin    Social History   Social History  . Marital Status: Single    Spouse Name: N/A  . Number of Children: N/A  . Years of Education: N/A   Social History Main Topics  . Smoking status: Current Every Day Smoker -- 1.00 packs/day    Types: Cigarettes  . Smokeless tobacco: None  . Alcohol Use: No  . Drug Use: Yes    Special: Heroin, Cocaine     Comment: heroin  . Sexual Activity: Yes   Other Topics Concern  . None   Social History Narrative   Additional Social History:   Sleep: improved  Appetite:   Improved   Current Medications: Current Facility-Administered Medications  Medication Dose Route Frequency Provider Last Rate Last Dose  . acetaminophen (TYLENOL) tablet 650 mg  650 mg Oral Q6H PRN Lurena Nida, NP   650 mg at 12/05/14 1540  . alum & mag hydroxide-simeth (MAALOX/MYLANTA) 200-200-20 MG/5ML suspension 30 mL  30 mL Oral Q4H PRN Lurena Nida, NP      . citalopram (CELEXA) tablet 40 mg  40 mg Oral Daily Ursula Alert, MD   40 mg at 12/05/14 1351  . clonazePAM (KLONOPIN) tablet 0.5 mg  0.5 mg Oral BID Jenne Campus, MD   0.5 mg at 12/05/14 1645  . cloNIDine (CATAPRES) tablet 0.1 mg  0.1 mg Oral BH-qamhs Lurena Nida, NP       Followed by  . [START ON 12/08/2014] cloNIDine (CATAPRES) tablet 0.1 mg  0.1 mg Oral QAC breakfast Lurena Nida, NP      . dicyclomine (BENTYL) tablet 20 mg  20 mg Oral Q6H PRN Lurena Nida, NP   20 mg at 12/05/14 1539  . doxycycline  (VIBRA-TABS) tablet 100 mg  100 mg Oral Q12H Lurena Nida, NP   100 mg at 12/05/14 1356  . gabapentin (NEURONTIN) capsule 100 mg  100 mg Oral TID Jenne Campus, MD   100 mg at 12/05/14 1645  . hydrOXYzine (ATARAX/VISTARIL) tablet 25 mg  25 mg Oral Q6H PRN Lurena Nida, NP   25 mg at 12/05/14 1539  . loperamide (IMODIUM) capsule 2-4 mg  2-4 mg Oral PRN Lurena Nida, NP      . magnesium hydroxide (MILK OF MAGNESIA) suspension 30 mL  30 mL Oral Daily PRN Lurena Nida, NP      . methocarbamol (ROBAXIN) tablet 500 mg  500 mg Oral Q8H PRN Lurena Nida, NP   500 mg at 12/05/14 1359  . naproxen (NAPROSYN) tablet 500 mg  500 mg Oral BID PRN Lurena Nida, NP   500 mg at 12/05/14 1539  . nicotine (NICODERM CQ - dosed in mg/24 hours) patch 21 mg  21 mg Transdermal Daily Jenne Campus, MD   21 mg at 12/05/14 1354  . ondansetron (ZOFRAN-ODT) disintegrating tablet 4 mg  4 mg Oral Q6H PRN Lurena Nida, NP   4 mg at 12/03/14 1643  . QUEtiapine (SEROQUEL) tablet 75 mg  75 mg Oral QHS Jenne Campus, MD   75 mg at 12/04/14 2132    Lab Results: No results found for this or any previous visit (from the past 48 hour(s)).  Physical Findings: AIMS: Facial and Oral Movements Muscles of Facial Expression: None, normal Lips and Perioral Area: None, normal Jaw: None, normal Tongue: None, normal,Extremity Movements Upper (arms, wrists, hands, fingers): None, normal Lower (legs, knees, ankles, toes): None, normal, Trunk Movements Neck, shoulders, hips: None, normal, Overall Severity Severity of abnormal movements (highest score from questions above): None, normal Incapacitation due to abnormal movements: None, normal Patient's awareness of abnormal movements (rate only patient's report): No Awareness, Dental Status Current problems with teeth and/or dentures?: No Does patient usually wear dentures?: No  CIWA:  CIWA-Ar Total: 6 COWS:  COWS Total Score: 0  Musculoskeletal: Strength & Muscle Tone:  within normal limits Gait & Station: normal Patient leans: N/A  Psychiatric Specialty Exam: ROS  Improving nausea, decreased discomfort ,  denies vomiting or diarrhea today  Blood pressure 118/81, pulse 84, temperature 98.7 F (37.1 C), temperature source Oral, resp. rate 16, height _0  (1.626 m), weight 136 lb (61.689 kg).Body mass index is 23.33 kg/(m^2).  General Appearance: Well Groomed  Engineer, water::  Good  Speech:  Normal Rate  Volume:  Normal  Mood:   Improving , states feels better   Affect:  More reactive, still anxious  Thought Process:  Goal Directed and Linear  Orientation:  Full (Time, Place, and Person)  Thought Content:  denies hallucinations, no delusions   Suicidal Thoughts:  No- denies any suicidal ideations at present , denies any self injurious ideations  Homicidal Thoughts:  No  Memory:  recent and remote grossly intact   Judgement:  Fair  Insight:  Present  Psychomotor Activity:   Less restless today, does not appear to be in any acute distress   Concentration:  Good  Recall:  Good  Fund of Knowledge:Good  Language: Good  Akathisia:  Negative  Handed:  Right  AIMS (if indicated):     Assets:  Desire for Improvement Resilience  ADL's:  Fair   Cognition: WNL  Sleep:  Number of Hours: 6.75  Assessment -  Patient improved compared to initial presentation- less depressed, less restless, less agitated, still anxious. Describes improved mood , less depression, improved sleep. Currently vitals stable and not in any acute distress.   Treatment Plan Summary: Daily contact with patient to assess and evaluate symptoms and progress in treatment, Medication management, Plan inpatient admission  and medications as below  Continue to encourage group, milieu participation to work on coping skills and symptom reduction Continue to encourage relapse prevention efforts  Continue  Klonopin 0.5 mgrs BID to address anxiety. Plan is for BZD to be short course, and to be D/C  d prior to discharge- patient expresses understanding. Continue Clonidine taper to address opiate WDL symptoms Continue Celexa 40 mgrs QDAY for depression, anxiety- of note, states she has been on this dose x 2 years  Decrease  Seroquel  To 50 mgrs QHS for night time anxiety, restlessness and insomnia Increase Neurontin to 200 mgrs BID for management of anxiety and chronic pain  Jobani Sabado 12/05/2014, 4:47 PM

## 2014-12-05 NOTE — Progress Notes (Signed)
PATIENT SIGNED 72 HR REQUEST FOR DISCHARGE ON 12/05/2014 AT 1600

## 2014-12-05 NOTE — [facility] Group Notes (Signed)
Interdisciplinary Treatment Plan Update (Adult) Date: 12/05/2014   Date: 12/05/2014 9:50 AM  Progress in Treatment:  Attending groups: No Participating in groups: No Taking medication as prescribed: Yes  Tolerating medication: Yes  Family/Significant othe contact made: No, Pt declines Patient understands diagnosis: Yes Discussing patient identified problems/goals with staff: Yes  Medical problems stabilized or resolved: Yes  Denies suicidal/homicidal ideation: Yes Patient has not harmed self or Others: Yes   New problem(s) identified: None identified at this time.   Discharge Plan or Barriers: Pt will discharge to her parent's home and follow-up with a suboxone management.  Additional comments: n/a   Reason for Continuation of Hospitalization:  Depression Medication stabilization Suicidal ideation Withdrawal symptoms  Estimated length of stay: 3-5 days  Review of initial/current patient goals per problem list:   1.  Goal(s): Patient will participate in aftercare plan  Met:  Yes  Target date: 3-5 days from date of admission   As evidenced by: Patient will participate within aftercare plan AEB aftercare provider and housing plan at discharge being identified.   12/05/14: Pt will discharge to her parent's home and follow-up with a suboxone management maintenance  2.  Goal (s): Patient will exhibit decreased depressive symptoms and suicidal ideations.  Met:  No  Target date: 3-5 days from date of admission   As evidenced by: Patient will utilize self rating of depression at 3 or below and demonstrate decreased signs of depression or be deemed stable for discharge by MD.  12/05/14: Pt is remaining withdrawn on the unit, withdrawing.  4.  Goal(s): Patient will demonstrate decreased signs of withdrawal due to substance abuse  Met:  No  Target date: 3-5 days from date of admission   As evidenced by: Patient will produce a CIWA/COWS score of 0, have stable vitals  signs, and no symptoms of withdrawal  12/05/14: Pt COWS score of 6; endorses increased resting pulse rate, restlessness, joint aches, upset GI, and anxiety as symptoms of withdrawal.  Attendees:  Patient:    Family:    Physician: Dr. Parke Poisson, MD  12/05/2014 9:50 AM  Nursing: Lars Pinks, RN Case manager  12/05/2014 9:50 AM  Clinical Social Worker Norman Clay, MSW 12/05/2014 9:50 AM  Other: Lucinda Dell, Beverly Sessions Liasion 12/05/2014 9:50 AM  Clinical: Grayland Ormond, RN 12/05/2014 9:50 AM  Other: , RN Charge Nurse 12/05/2014 9:50 AM  Other:     Peri Maris, [place] MSW

## 2014-12-05 NOTE — [facility] Group Notes (Signed)
The focus of this group is to educate the patient on the purpose and policies of crisis stabilization and provide a format to answer questions about their admission.  The group details unit policies and expectations of patients while admitted.  Patient did not attend 0900 nurse education orientation group this morning.  Patient stayed in bed.   

## 2014-12-05 NOTE — Progress Notes (Addendum)
D:  Patient's self inventory sheet, patient slept good last night, sleep medication was helpful.  Good appetite, low energy level, poor concentration.  Rated depression and hopeless 8, anxiety 10.  Withdrawals, tremors, cravings, agitation, runny nose, muscle spasms.  Denied SI.  Physical problems headaches, lightheaded, pain.  Worst pain #9 in past 24 hours, back, legs, everywhere.  Goal is to shower, socialize, Plans to stay awake and mingle.  Does have discharge plans.  "If my parents don't pick me up, I will be homeless and will relapse. A:  Medications administered per MD orders.  Emotional support and encouragement given patient. R:  Denied SI and HI to nurse, contracts for safety.  Denied A/V hallucinations.  Safety maintained with 15 minute checks.  Patient refused to get out of bed this morning.  Patient refused VS and morning medications.  Patient stayed in bed until lunch and demanded all her medications at that time.  MD explained to nurse that he wanted to talk to patient before morning meds were given to her.  Patient told nurse this afternoon that she was going to act out if she did not get the medications she wanted.  Repeatedly asked to see MD when he was in conference with other patients.  Patient told nurse she wanted all the medicines she could get.  Emotional support and encouragement given patient.  PATIENT SIGNED 72 HR REQUEST FOR DISCHARGE ON 12/05/2014 AT 1600.

## 2014-12-05 NOTE — Progress Notes (Signed)
Recreation Therapy Notes  Animal-Assisted Activity (AAA) Program Checklist/Progress Notes Patient Eligibility Criteria Checklist & Daily Group note for Rec Tx Intervention  Date: 11.15.2016 Time: 2:45pm Location: [address] Dayroom    AAA/T Program Assumption of Risk Form signed by Patient/ or Parent Legal Guardian yes  Patient is free of allergies or sever asthma yes  Patient reports no fear of animals yes  Patient reports no history of cruelty to animals yes  Patient understands his/her participation is voluntary yes  Patient washes hands before animal contact yes  Patient washes hands after animal contact yes  Behavioral Response: Appropriate   Education: Hand Washing, Appropriate Animal Interaction   Education Outcome: Acknowledges education.   Clinical Observations/Feedback: Patient appropriately interacted with therapy dog and peers during session.   Towanda Hornstein L Skylar Priest, LRT/CTRS        Maylie Ashton L 12/05/2014 3:59 PM 

## 2014-12-06 LAB — HEPATITIS C ANTIBODY: HCV Ab: >11 s/co ratio — ABNORMAL HIGH (ref 0.0–0.9)

## 2014-12-06 LAB — HIV ANTIBODY (ROUTINE TESTING W REFLEX): HIV Screen 4th Generation wRfx: NONREACTIVE

## 2014-12-06 LAB — HEPATITIS B SURFACE ANTIGEN: Hepatitis B Surface Ag: NEGATIVE

## 2014-12-06 MED ORDER — GABAPENTIN 300 MG PO CAPS
300.0000 mg | ORAL_CAPSULE | Freq: Two times a day (BID) | ORAL | Status: DC
  Filled 2014-12-06 (×2): qty 1

## 2014-12-06 MED ORDER — GABAPENTIN 400 MG PO CAPS
400.0000 mg | ORAL_CAPSULE | Freq: Three times a day (TID) | ORAL | Status: DC
  Administered 2014-12-06: 300 mg via ORAL
  Administered 2014-12-06 – 2014-12-07 (×3): 400 mg via ORAL
  Filled 2014-12-06 (×7): qty 1

## 2014-12-06 MED ORDER — LIDOCAINE 5 % EX PTCH
1.0000 | MEDICATED_PATCH | Freq: Every day | CUTANEOUS | Status: DC
  Administered 2014-12-06 – 2014-12-08 (×2): 1 via TRANSDERMAL
  Filled 2014-12-06 (×5): qty 1
  Filled 2014-12-06: qty 7

## 2014-12-06 MED ORDER — GABAPENTIN 300 MG PO CAPS
300.0000 mg | ORAL_CAPSULE | Freq: Three times a day (TID) | ORAL | Status: DC
  Administered 2014-12-06: 300 mg via ORAL
  Filled 2014-12-06 (×4): qty 1

## 2014-12-06 NOTE — Progress Notes (Signed)
[facility] LCSW Group Therapy 12/06/2014 1:15 PM  Type of Therapy: Group Therapy- Emotion Regulation  Participation Level: Active but Monopolizing at times  Participation Quality:  Appropriate  Affect: Labile  Cognitive: Alert and Oriented   Insight:  Developing/Improving  Engagement in Therapy: Developing/Improving and Engaged   Modes of Intervention: Clarification, Confrontation, Discussion, Education, Exploration, Limit-setting, Orientation, Problem-solving, Rapport Building, Dance movement psychotherapisteality Testing, Socialization and Support  Summary of Progress/Problems: The topic for group today was emotional regulation. This group focused on both positive and negative emotion identification and allowed group members to process ways to identify feelings, regulate negative emotions, and find healthy ways to manage internal/external emotions. Group members were asked to reflect on a time when their reaction to an emotion led to a negative outcome and explored how alternative responses using emotion regulation would have benefited them. Group members were also asked to discuss a time when emotion regulation was utilized when a negative emotion was experienced. Pt expressed significant guilt and resentment towards herself her ex-boyfriend who committed suicide before their daughter was born. Pt was observed to be tearful but was receptive to feedback. Pt acknowledged her need to face her trauma.   Chad CordialLauren Carter, LCSWA 12/06/2014 2:40 PM

## 2014-12-06 NOTE — Progress Notes (Signed)
D    Pt is pleasant on approach and cooperative   She reports feeling much better than she did yesterday and said she is glad she is back on her medications   Pt requests medication frequently and as much as she can have as often as she can have it   Her interaction with others is appropriate but minimal   She can be demanding and entitled at times A   Verbal support given   Medications administered and effectiveness monitored   Q 15 min checks   Educated on medications and alternative ways to cope  R   Pt is safe and receptive to verbal support

## 2014-12-06 NOTE — Progress Notes (Signed)
Adult Psychoeducational Group Note  Date:  12/06/2014 Time:  9:29 PM  Group Topic/Focus:  Wrap-Up Group:   The focus of this group is to help patients review their daily goal of treatment and discuss progress on daily workbooks.  Participation Level:  Active  Participation Quality:  Appropriate  Affect:  Appropriate  Cognitive:  Alert  Insight: Appropriate  Engagement in Group:  Engaged  Modes of Intervention:  Discussion  Additional Comments:  Patient stated having a good day. Patient woke up to everyone telling her happy birthday. Patient stated something positive that happened today was being able to talk to her mother, along with her kids. Patient stated it has been 11 years since she has been sober on her birthday. Patient stated her goal for today was to stay positive.   Jull Harral L Ronald Londo 12/06/2014, 9:29 PM

## 2014-12-06 NOTE — Progress Notes (Signed)
Patient ID: Brenda Stafford, female   DOB: July 18, 1985, 29 y.o.   MRN: [id number] [facility] MD Progress Note  12/06/2014 1:19 PM Brenda Stafford  MRN:  [id number] Subjective:      Patient reports improved mood but ongoing severe anxiety. She also reports chronic pain, particularly affecting her lower back/ legs. She states that Neurontin has been helpful in the past " but at much higher doses ". She denies medication side effects.  Objective : I have discussed case with treatment team and have met with patient. Patient presents intermittently anxious, needy, and focused on medication issues . Describes high motivation in sobriety, currently planning on initiating suboxone management after discharge. She describes chronic pain, and is wanting to increase Neurontin dose, which she states has helped in the past. Of note, she had stopped this medication for several weeks prior to this admission, but denies having side effects from it . Some ongoing symptoms of WDL- particularly restlessness and describes severe cravings at present. Although restless and needy, no grossly disruptive behaviors on unit. She has been going to groups . She is future oriented, of note, states she plans to go for outpatient Suboxone maintenance following discharge .  Marland Kitchen  Principal Problem: Major depressive disorder, recurrent, severe without psychotic features ([place]) Diagnosis:   Patient Active Problem List   Diagnosis Date Noted  . Major depressive disorder, recurrent, severe without psychotic features ([place]) [F33.2]   . H/O borderline personality disorder [Z86.59] 09/25/2014  . Opioid use disorder, moderate, dependence ([place]) [F11.20] 09/25/2014  . Cocaine use disorder, moderate, dependence ([place]) [F14.20]    Total Time spent with patient: 25 minutes     Past Medical History:  Past Medical History  Diagnosis Date  . Endocarditis   . Osteomyelitis ([place])   . Sciatica   . Bipolar 1 disorder ([place])   . Personality disorder   .  Depression   . Heroin addiction ([place])   . Anxiety   . Cocaine addiction ([place])   . Suicidal ideation    History reviewed. No pertinent past surgical history. Family History: History reviewed. No pertinent family history.  Social History:  History  Alcohol Use No     History  Drug Use  . Yes  . Special: Heroin, Cocaine    Comment: heroin    Social History   Social History  . Marital Status: Single    Spouse Name: N/A  . Number of Children: N/A  . Years of Education: N/A   Social History Main Topics  . Smoking status: Current Every Day Smoker -- 1.00 packs/day    Types: Cigarettes  . Smokeless tobacco: None  . Alcohol Use: No  . Drug Use: Yes    Special: Heroin, Cocaine     Comment: heroin  . Sexual Activity: Yes   Other Topics Concern  . None   Social History Narrative   Additional Social History:   Sleep:  Fair   Appetite:   Improved   Current Medications: Current Facility-Administered Medications  Medication Dose Route Frequency Provider Last Rate Last Dose  . acetaminophen (TYLENOL) tablet 650 mg  650 mg Oral Q6H PRN Lurena Nida, NP   650 mg at 12/05/14 1540  . alum & mag hydroxide-simeth (MAALOX/MYLANTA) 200-200-20 MG/5ML suspension 30 mL  30 mL Oral Q4H PRN Lurena Nida, NP      . citalopram (CELEXA) tablet 40 mg  40 mg Oral Daily Ursula Alert, MD   40 mg at 12/06/14 0816  . clonazePAM (KLONOPIN)  tablet 0.5 mg  0.5 mg Oral BID Jenne Campus, MD   0.5 mg at 12/06/14 0816  . cloNIDine (CATAPRES) tablet 0.1 mg  0.1 mg Oral BH-qamhs Lurena Nida, NP   0.1 mg at 12/06/14 0817   Followed by  . [START ON 12/08/2014] cloNIDine (CATAPRES) tablet 0.1 mg  0.1 mg Oral QAC breakfast Lurena Nida, NP      . dicyclomine (BENTYL) tablet 20 mg  20 mg Oral Q6H PRN Lurena Nida, NP   20 mg at 12/05/14 1539  . doxycycline (VIBRA-TABS) tablet 100 mg  100 mg Oral Q12H Lurena Nida, NP   100 mg at 12/06/14 0816  . gabapentin (NEURONTIN) capsule 400 mg  400 mg Oral  TID Jenne Campus, MD      . hydrOXYzine (ATARAX/VISTARIL) tablet 25 mg  25 mg Oral Q6H PRN Lurena Nida, NP   25 mg at 12/06/14 1104  . loperamide (IMODIUM) capsule 2-4 mg  2-4 mg Oral PRN Lurena Nida, NP      . magnesium hydroxide (MILK OF MAGNESIA) suspension 30 mL  30 mL Oral Daily PRN Lurena Nida, NP      . methocarbamol (ROBAXIN) tablet 500 mg  500 mg Oral Q8H PRN Lurena Nida, NP   500 mg at 12/06/14 1300  . naproxen (NAPROSYN) tablet 500 mg  500 mg Oral BID PRN Lurena Nida, NP   500 mg at 12/05/14 2020  . nicotine (NICODERM CQ - dosed in mg/24 hours) patch 21 mg  21 mg Transdermal Daily Jenne Campus, MD   21 mg at 12/06/14 0818  . ondansetron (ZOFRAN-ODT) disintegrating tablet 4 mg  4 mg Oral Q6H PRN Lurena Nida, NP   4 mg at 12/03/14 1643  . QUEtiapine (SEROQUEL) tablet 75 mg  75 mg Oral QHS Jenne Campus, MD   75 mg at 12/05/14 2211    Lab Results: No results found for this or any previous visit (from the past 79 hour(s)).  Physical Findings: AIMS: Facial and Oral Movements Muscles of Facial Expression: None, normal Lips and Perioral Area: None, normal Jaw: None, normal Tongue: None, normal,Extremity Movements Upper (arms, wrists, hands, fingers): None, normal Lower (legs, knees, ankles, toes): None, normal, Trunk Movements Neck, shoulders, hips: None, normal, Overall Severity Severity of abnormal movements (highest score from questions above): None, normal Incapacitation due to abnormal movements: None, normal Patient's awareness of abnormal movements (rate only patient's report): No Awareness, Dental Status Current problems with teeth and/or dentures?: No Does patient usually wear dentures?: No  CIWA:  CIWA-Ar Total: 1 COWS:  COWS Total Score: 2  Musculoskeletal: Strength & Muscle Tone: within normal limits Gait & Station: normal Patient leans: N/A  Psychiatric Specialty Exam: ROS  Improving nausea, decreased discomfort ,  denies vomiting or  diarrhea today, describes some chronic back pain  Blood pressure 114/67, pulse 69, temperature 98.3 F (36.8 C), temperature source Oral, resp. rate 16, height _0  (1.626 m), weight 136 lb (61.689 kg).Body mass index is 23.33 kg/(m^2).  General Appearance: Well Groomed  Engineer, water::  Good  Speech:  Normal Rate  Volume:  Normal  Mood:   Improved, less depressed, remains anxious   Affect:   Still anxious, but reactive , smiles at times appropriately  Thought Process:  Goal Directed and Linear  Orientation:  Full (Time, Place, and Person)  Thought Content:  denies hallucinations, no delusions   Suicidal Thoughts:  No- denies  any suicidal ideations at present , denies any self injurious ideations  Homicidal Thoughts:  No  Memory:  recent and remote grossly intact   Judgement:  Fair  Insight:  Present  Psychomotor Activity:   Some ongoing psychomotor restlessness   Concentration:  Good  Recall:  Good  Fund of Knowledge:Good  Language: Good  Akathisia:  Negative  Handed:  Right  AIMS (if indicated):     Assets:  Desire for Improvement Resilience  ADL's:  Fair   Cognition: WNL  Sleep:  Number of Hours: 4.5  Assessment - gradually improving , less depressed , still anxious, restless, which may be related to some residual WDL and to current cravings for opiates. She does describe chronic anxiety even when sober. Neurontin has been helpful for both pain and anxiety, and is interested in further titration. Tolerating medications well. Future oriented .   Treatment Plan Summary: Daily contact with patient to assess and evaluate symptoms and progress in treatment, Medication management, Plan inpatient admission  and medications as below  Continue to encourage group, milieu participation to work on coping skills and symptom reduction Continue to encourage relapse prevention efforts  Continue  Klonopin 0.5 mgrs BID to address anxiety. Plan is for BZD to be short course, and to be D/C d  prior to discharge- patient expresses understanding. Continue Clonidine taper to address opiate WDL symptoms Continue Celexa 40 mgrs QDAY for depression, anxiety- of note, states she has been on this dose x 2 years  Continue  Seroquel   50 mgrs QHS for night time anxiety, restlessness and insomnia Increase Neurontin to 400 mgrs TID for management of anxiety and chronic pain Consider Lidoderm patch for lower back pain  COBOS, FERNANDO 12/06/2014, 1:19 PM

## 2014-12-06 NOTE — [facility] Group Notes (Signed)
[facility] Note  12/06/2014 8:45 AM  Participation Quality: Alert, Appropriate and Oriented  Mood/Affect: Appropriate  Depression Rating: 7-8  Anxiety Rating: 8-9  Thoughts of Suicide: Pt denies SI/HI  Will you contract for safety? Yes  Current AVH: Pt denies  Plan for Discharge/Comments: Pt attended discharge planning group and actively participated in group. CSW discussed suicide prevention education with the group and encouraged them to discuss discharge planning and any relevant barriers. Pt reports feeling great this morning and is requesting to go straight to the suboxone maintenance appointment from discharge.  Transportation Means: Pt reports access to transportation  Supports: No supports mentioned at this time  Chad CordialLauren Carter, LCSWA 12/06/2014 9:57 AM

## 2014-12-06 NOTE — BHH Group Notes (Signed)
Adult Psychoeducational Group Note  Date:  12/06/2014 Time:  12:05 AM  Group Topic/Focus:  Wrap-Up Group:   The focus of this group is to help patients review their daily goal of treatment and discuss progress on daily workbooks.  Participation Level:  Active  Participation Quality:  Appropriate  Affect:  Flat  Cognitive:  Appropriate  Insight: Good  Engagement in Group:  Engaged  Modes of Intervention:  Discussion  Additional Comments:  Patient stated her day was all right at first and that it got better because she talked to her children for the first time in five months.  She is discharging Friday.  Caroll RancherLindsay, Ajeet Casasola A 12/06/2014, 12:05 AM

## 2014-12-06 NOTE — Progress Notes (Signed)
D   Pt continues to be somewhat medication seeking but less so than yesterday   She attends groups and interacts well with others    A   Verbal support given   Medications administered and effectiveness monitored   Q 15 min checks R   Pt safe at present

## 2014-12-06 NOTE — Progress Notes (Signed)
D: Pt irritable and labile. Pt med seeking and threatening to explode in order to get prn meds. Pt denies withdrawal symptoms. Pt c/o ongoing muscle spasms in her legs. Pt denies suicidal thoughts. Pt rates depression 6/10. Pt c/o feeling anxious and requesting to have Klonopin increased to TID. Pt reports that she slept well last night. Pt disruptive to the unit and intrusive. Pt appears disheveled and continues to wear make up on her face from yesterday. A: Medications administered as ordered per MD. Verbal support given. Pt encouraged to attend groups. 15 minute checks performed for safety.  R: Pt receptive to tx. Pt safety maintained.

## 2014-12-07 MED ORDER — CLONAZEPAM 0.5 MG PO TABS
0.2500 mg | ORAL_TABLET | Freq: Two times a day (BID) | ORAL | Status: DC
  Administered 2014-12-07 – 2014-12-08 (×2): 0.25 mg via ORAL
  Filled 2014-12-07 (×2): qty 1

## 2014-12-07 MED ORDER — QUETIAPINE FUMARATE 50 MG PO TABS
50.0000 mg | ORAL_TABLET | Freq: Every day | ORAL | Status: DC
  Administered 2014-12-07: 50 mg via ORAL
  Filled 2014-12-07: qty 1
  Filled 2014-12-07: qty 7
  Filled 2014-12-07: qty 1

## 2014-12-07 MED ORDER — GABAPENTIN 300 MG PO CAPS
600.0000 mg | ORAL_CAPSULE | Freq: Three times a day (TID) | ORAL | Status: DC
  Administered 2014-12-07 – 2014-12-08 (×3): 600 mg via ORAL
  Filled 2014-12-07 (×2): qty 42
  Filled 2014-12-07: qty 2
  Filled 2014-12-07: qty 42
  Filled 2014-12-07 (×4): qty 2

## 2014-12-07 NOTE — B[facility] Group Notes (Addendum)
The focus of this group is to educate the patient on the purpose and policies of crisis stabilization and provide a format to answer questions about their admission.  The group details unit policies and expectations of patients while admitted.  Patient did not attend 0900 nurse education orientation group this morning.  Patient stayed in bed and would not respond to staff requests.

## 2014-12-07 NOTE — [facility] Group Notes (Signed)
[facility] Therapy 12/07/2014 1:15pm  Type of Therapy: Mental Health Association Presentation  Participation Level: Active  Participation Quality: Attentive  Affect: Appropriate  Cognitive: Oriented  Insight: Developing/Improving  Engagement in Therapy: Engaged  Modes of Intervention: Discussion, Education and Socialization  Summary of Progress/Problems: Mental Health Association (MHA) Speaker came to talk about his personal journey with substance abuse and addiction. The pt processed ways by which to relate to the speaker. MHA speaker provided handouts and educational information pertaining to groups and services offered by the [facility]. Pt was engaged in speaker's presentation and was receptive to resources provided.    Chad CordialLauren Carter, LCSWA 12/07/2014 1:52 PM

## 2014-12-07 NOTE — Tx Team (Addendum)
Interdisciplinary Treatment Plan Update (Adult) Date: 12/07/2014   Date: 12/07/2014 1:53 PM  Progress in Treatment:  Attending groups: Yes  Participating in groups: Yes  Taking medication as prescribed: Yes  Tolerating medication: Yes  Family/Significant othe contact made: No, Pt declines Patient understands diagnosis: Yes Discussing patient identified problems/goals with staff: Yes  Medical problems stabilized or resolved: Yes  Denies suicidal/homicidal ideation: Yes Patient has not harmed self or Others: Yes   New problem(s) identified: None identified at this time.   Discharge Plan or Barriers: Pt will discharge to her parent's home. She plans to go to suboxone or [facility]  Additional comments: n/a   Reason for Continuation of Hospitalization:  Depression Medication stabilization Suicidal ideation Withdrawal symptoms  Estimated length of stay: 1-2 days  Review of initial/current patient goals per problem list:   1.  Goal(s): Patient will participate in aftercare plan  Met:  Yes  Target date: 3-5 days from date of admission   As evidenced by: Patient will participate within aftercare plan AEB aftercare provider and housing plan at discharge being identified.   12/07/14: Pt will discharge to her parents' home and follow-up with outpatient resources   2.  Goal (s): Patient will exhibit decreased depressive symptoms and suicidal ideations.  Met:  Yes  Target date: 3-5 days from date of admission   As evidenced by: Patient will utilize self rating of depression at 3 or below and demonstrate decreased signs of depression or be deemed stable for discharge by MD.  12/08/14: Pt rates depression at 2/10; denies SI  4.  Goal(s): Patient will demonstrate decreased signs of withdrawal due to substance abuse  Met:  Adequate for DC  Target date: 3-5 days from date of admission   As evidenced by: Patient will produce a CIWA/COWS score of 0, have stable vitals  signs, and no symptoms of withdrawal  12/07/14: Pt has COWS score of 0; anxiety is only withdrawal symptom endorsed  Attendees:  Patient:    Family:    Physician: Dr. Parke Poisson, MD  12/07/2014 1:53 PM  Nursing: Lars Pinks, RN Case manager  12/07/2014 1:53 PM  Clinical Social Worker Peri Maris, [place] 12/07/2014 1:53 PM  Other: Tilden Fossa, [place] 12/07/2014 1:53 PM  Clinical: Debera Lat RN 12/07/2014 1:53 PM  Other: , RN Charge Nurse 12/07/2014 1:53 PM  Other: Hilda Lias, [place], [place] Work [phone]

## 2014-12-07 NOTE — Progress Notes (Signed)
Patient ID: Brenda Stafford, female   DOB: 12-03-1985, 29 y.o.   MRN: [id number] [facility] MD Progress Note  12/07/2014 1:43 PM Lativia Velie  MRN:  [id number] Subjective:     Patient reports she is gradually improving . Reports overall improvement of withdrawal symptoms. Does not endorse medication side effects, and feels medications are working. Chronic pain is an ongoing issue, for which she states Neurontin, at higher doses, is effective .  Objective : I have discussed case with treatment team and have met with patient. As discussed with Nursing staff patient tends to remain in bed in AM . At this time , however, fully alert, attentive, not drowsy. Patient denies excessive sedation from medications and states simply that she is " catching up on sleep" due to insomnia prior to admission. She is not presenting with any severe or significant opiate WDL symptom - she does report some ongoing anxiety. Mood and affect are improved . No medication side effect. Patient is future oriented and focused on wanting to start Suboxone management after discharge with the purpose of decreasing risk of relapse into illicit opiate abuse. Also considering going to Methadone Maintenance. We discussed these disposition options that patient is interested in - we stressed the importance of working recovery, such as by going to [facility], maintaining distance from people, places and situations that she associates with drug use, in addition to agonist therapy. Of note, patient is Hep C (+) . She states " I kind of knew it already". She is negative for HIV and Hep . B . We reviewed importance of outpatient follow up with her PCP/ Infectious Disease in order to work on monitoring and treatment . Patient visible on unit, less agitated / restless today.   .  Principal Problem: Major depressive disorder, recurrent, severe without psychotic features ([place]) Diagnosis:   Patient Active Problem List   Diagnosis Date Noted  . Major  depressive disorder, recurrent, severe without psychotic features ([place]) [F33.2]   . H/O borderline personality disorder [Z86.59] 09/25/2014  . Opioid use disorder, moderate, dependence ([place]) [F11.20] 09/25/2014  . Cocaine use disorder, moderate, dependence ([place]) [F14.20]    Total Time spent with patient: 25 minutes     Past Medical History:  Past Medical History  Diagnosis Date  . Endocarditis   . Osteomyelitis ([place])   . Sciatica   . Bipolar 1 disorder ([place])   . Personality disorder   . Depression   . 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 Anxiety   . Cocaine addiction ([place])   . Suicidal ideation    History reviewed. No pertinent past surgical history. Family History: History reviewed. No pertinent family history.  Social History:  History  Alcohol Use No     History  Drug Use  . Yes  . Special: Heroin, Cocaine    Comment: heroin    Social History   Social History  . Marital Status: Single    Spouse Name: N/A  . Number of Children: N/A  . Years of Education: N/A   Social History Main Topics  . Smoking status: Current Every Day Smoker -- 1.00 packs/day    Types: Cigarettes  . Smokeless tobacco: None  . Alcohol Use: No  . Drug Use: Yes    Special: Heroin, Cocaine     Comment: heroin  . Sexual Activity: Yes   Other Topics Concern  . None   Social History Narrative   Additional Social History:   Sleep:   Improved   Appetite:  Improved   Current Medications: Current Facility-Administered Medications  Medication Dose Route Frequency Provider Last Rate Last Dose  . acetaminophen (TYLENOL) tablet 650 mg  650 mg Oral Q6H PRN Lurena Nida, NP   650 mg at 12/05/14 1540  . alum & mag hydroxide-simeth (MAALOX/MYLANTA) 200-200-20 MG/5ML suspension 30 mL  30 mL Oral Q4H PRN Lurena Nida, NP      . citalopram (CELEXA) tablet 40 mg  40 mg Oral Daily Saramma Eappen, MD   40 mg at 12/07/14 1030  . clonazePAM (KLONOPIN) tablet 0.25 mg  0.25 mg Oral BID Jenne Campus, MD       . Derrill Memo ON 12/08/2014] cloNIDine (CATAPRES) tablet 0.1 mg  0.1 mg Oral QAC breakfast Lurena Nida, NP      . dicyclomine (BENTYL) tablet 20 mg  20 mg Oral Q6H PRN Lurena Nida, NP   20 mg at 12/05/14 1539  . doxycycline (VIBRA-TABS) tablet 100 mg  100 mg Oral Q12H Lurena Nida, NP   100 mg at 12/07/14 1031  . gabapentin (NEURONTIN) capsule 600 mg  600 mg Oral TID Jenne Campus, MD      . hydrOXYzine (ATARAX/VISTARIL) tablet 25 mg  25 mg Oral Q6H PRN Lurena Nida, NP   25 mg at 12/06/14 2140  . lidocaine (LIDODERM) 5 % 1 patch  1 patch Transdermal Daily Jenne Campus, MD   1 patch at 12/06/14 1817  . loperamide (IMODIUM) capsule 2-4 mg  2-4 mg Oral PRN Lurena Nida, NP      . magnesium hydroxide (MILK OF MAGNESIA) suspension 30 mL  30 mL Oral Daily PRN Lurena Nida, NP      . methocarbamol (ROBAXIN) tablet 500 mg  500 mg Oral Q8H PRN Lurena Nida, NP   500 mg at 12/07/14 1037  . naproxen (NAPROSYN) tablet 500 mg  500 mg Oral BID PRN Lurena Nida, NP   500 mg at 12/06/14 1605  . nicotine (NICODERM CQ - dosed in mg/24 hours) patch 21 mg  21 mg Transdermal Daily Myer Peer Cobos, MD   21 mg at 12/07/14 1030  . ondansetron (ZOFRAN-ODT) disintegrating tablet 4 mg  4 mg Oral Q6H PRN Lurena Nida, NP   4 mg at 12/03/14 1643  . QUEtiapine (SEROQUEL) tablet 50 mg  50 mg Oral QHS Jenne Campus, MD        Lab Results:  Results for orders placed or performed during the hospital encounter of 12/02/14 (from the past 48 hour(s))  Hepatitis C antibody     Status: Abnormal   Collection Time: 12/05/14  7:15 PM  Result Value Ref Range   HCV Ab >11.0 (H) 0.0 - 0.9 s/co ratio    Comment: (NOTE)                                  Negative:     < 0.8                             Indeterminate: 0.8 - 0.9                                  Positive:     > 0.9 The CDC recommends that a positive HCV antibody result be  followed up with a HCV Nucleic Acid Amplification test (812751). Performed At: [facility] [place], [place] [id number] Lindon Romp MD [id number] Performed at [facility]   HIV antibody     Status: None   Collection Time: 12/05/14  7:15 PM  Result Value Ref Range   HIV Screen 4th Generation wRfx Non Reactive Non Reactive    Comment: (NOTE) Performed At: [facility] [place], [place] [id number] Lindon Romp MD [id number] Performed at [facility]   Hepatitis B surface antigen     Status: None   Collection Time: 12/05/14  7:15 PM  Result Value Ref Range   Hepatitis B Surface Ag Negative Negative    Comment: (NOTE) Performed At: [facility] [place], [place] [id number] Lindon Romp MD [id number] Performed at [facility]     Physical Findings: AIMS: Facial and Oral Movements Muscles of Facial Expression: None, normal Lips and Perioral Area: None, normal Jaw: None, normal Tongue: None, normal,Extremity Movements Upper (arms, wrists, hands, fingers): None, normal Lower (legs, knees, ankles, toes): None, normal, Trunk Movements Neck, shoulders, hips: None, normal, Overall Severity Severity of abnormal movements (highest score from questions above): None, normal Incapacitation due to abnormal movements: None, normal Patient's awareness of abnormal movements (rate only patient's report): No Awareness, Dental Status Current problems with teeth and/or dentures?: No Does patient usually wear dentures?: No  CIWA:  CIWA-Ar Total: 1 COWS:  COWS Total Score: 1  Musculoskeletal: Strength & Muscle Tone: within normal limits Gait & Station: normal Patient leans: N/A  Psychiatric Specialty Exam: ROS  Improving nausea, decreased discomfort ,  denies vomiting or diarrhea today, describes some chronic back pain  Blood pressure 131/75, pulse 93, temperature 98.2 F (36.8 C), temperature source Oral, resp. rate 16, height 5'  4" (1.626 m), weight 136 lb (61.689 kg).Body mass index is 23.33 kg/(m^2).  General Appearance: Well Groomed  Engineer, water::  Good  Speech:  Normal Rate  Volume:  Normal  Mood:   Gradually improving   Affect:    Less severe anxiety, more reactive   Thought Process:  Goal Directed and Linear  Orientation:  Full (Time, Place, and Person)  Thought Content:  denies hallucinations, no delusions   Suicidal Thoughts:  No- denies any suicidal ideations at present , denies any self injurious ideations  Homicidal Thoughts:  No  Memory:  recent and remote grossly intact   Judgement:  Fair  Insight:  Present  Psychomotor Activity:   Today calmer, less restless   Concentration:  Good  Recall:  Good  Fund of Knowledge:Good  Language: Good  Akathisia:  Negative  Handed:  Right  AIMS (if indicated):     Assets:  Desire for Improvement Resilience  ADL's:   Improved   Cognition: WNL  Sleep:  Number of Hours: 5  Assessment -  Continues to gradually improve compared to admission- less dysphoric, affect more reactive and brighter in general, although still anxious. No current severe opiate WDL symptoms. Tolerating medications well at this time. Focusing more on disposition options, and states her goal is to start opiate agonist therapy with either methadone or suboxone . She is somewhat anxious about Hep C (+) finding, but states she is not surprised and expresses understanding about the importance of getting follow up and eventual treatment for this condition. Patient tolerating Neurontin well, feels it is helping her pain and anxiety. She is hoping  to titrate it further to help address residual symptoms.     Treatment Plan Summary: Daily contact with patient to assess and evaluate symptoms and progress in treatment, Medication management, Plan inpatient admission  and medications as below  Continue to encourage group, milieu participation to work on coping skills and symptom reduction Continue to  encourage relapse prevention efforts  Decrease   Klonopin 0.25 mgrs BID to address anxiety. Plan is  To taper off gradually Completing  Clonidine taper to address opiate WDL symptoms Continue Celexa 40 mgrs QDAY for depression, anxiety- of note, states she has been on this dose x 2 years  Decrease   Seroquel   To 25  mgrs QHS for night time anxiety, restlessness and insomnia ( rationale to decrease dose is to minimize sedation in AM)  Increase Neurontin to 600  mgrs TID for management of anxiety and chronic pain Consider Lidoderm patch for lower back pain  COBOS, FERNANDO 12/07/2014, 1:43 PM

## 2014-12-07 NOTE — Progress Notes (Addendum)
Patient was seen in dining room this morning at breakfast and patient was alert, walking and talking to peers.  Patient has refused 4 times to get out of bed to take her morning medications.  Patient laying in bed, eyes closed, would not sit up or hold arm up for nurse to scan her arm bracelet.  Patient will roll over in bed and ignore nurse.  MD informed.  Patient would not fill out self inventory form this morning.  Patient stated I heard you this morning, but I am lazy and did not want to answer you.

## 2014-12-07 NOTE — BHH Group Notes (Signed)
Adult Psychoeducational Group Note  Date:  12/07/2014 Time:  9:50 PM  Group Topic/Focus:  Wrap-Up Group:   The focus of this group is to help patients review their daily goal of treatment and discuss progress on daily workbooks.  Participation Level:  Did Not Attend  Participation Quality:  None  Affect:  None  Cognitive:  None  Insight: None  Engagement in Group:  None  Modes of Intervention:  Discussion  Additional Comments:  Patient did not attend group.  Caroll RancherLindsay, Otilio Groleau A 12/07/2014, 9:50 PM

## 2014-12-08 MED ORDER — LIDOCAINE 5 % EX PTCH: 1.0000 | MEDICATED_PATCH | Freq: Every day | CUTANEOUS | Status: AC

## 2014-12-08 MED ORDER — QUETIAPINE FUMARATE 50 MG PO TABS: 50.0000 mg | ORAL_TABLET | Freq: Every day | ORAL | Status: AC

## 2014-12-08 MED ORDER — CLONAZEPAM 0.5 MG PO TABS: 0.2500 mg | ORAL_TABLET | Freq: Two times a day (BID) | ORAL | Status: AC

## 2014-12-08 MED ORDER — NICOTINE 21 MG/24HR TD PT24: 21.0000 mg | MEDICATED_PATCH | Freq: Every day | TRANSDERMAL | Status: AC

## 2014-12-08 MED ORDER — CITALOPRAM HYDROBROMIDE 40 MG PO TABS: 40.0000 mg | ORAL_TABLET | Freq: Every day | ORAL | Status: AC

## 2014-12-08 MED ORDER — GABAPENTIN 300 MG PO CAPS: 600.0000 mg | ORAL_CAPSULE | Freq: Three times a day (TID) | ORAL | Status: AC

## 2014-12-08 MED ORDER — DOXYCYCLINE HYCLATE 100 MG PO TABS: 100.0000 mg | ORAL_TABLET | Freq: Two times a day (BID) | ORAL | Status: AC

## 2014-12-08 NOTE — Progress Notes (Signed)
D: Pt D/C home as ordered. Picked up by parents. Denied SI, HI, AVH and pain at the time.  A: D/C instructions reviewed with patient including medications (dosage & time) and follow up appointment. All belongings in locker 37 returned to patient prior to departure. Vitals WNL. Q 15 minutes checks remained effective for safety till time of d/c without gestures or event of self injurious behavior to report.  R: Pt verbalized understanding of d/c instructions when reviewed. Signed belonging sheet in agreement with items received. Denied adverse drug reactions when assessed. No physical distress evident at time of departure from facility. Safety maintained.

## 2014-12-08 NOTE — BHH Suicide Risk Assessment (Signed)
[facility] Discharge Suicide Risk Assessment   Demographic Factors:  29 year old single female   Total Time spent with patient: 30 minutes  Musculoskeletal: Strength & Muscle Tone: within normal limits Gait & Station: normal Patient leans: N/A  Psychiatric Specialty Exam: Physical Exam  ROS  Blood pressure 110/68, pulse 67, temperature 97.9 F (36.6 C), temperature source Oral, resp. rate 18, height  (1.626 m), weight 136 lb (61.689 kg).Body mass index is 23.33 kg/(m^2).  General Appearance: Well Groomed  Eye Contact::  Good  Speech:  Normal Rate409  Volume:  Normal  Mood:  improved and currently euthymic  Affect:  Appropriate and more reactive   Thought Process:  Linear  Orientation:  Full (Time, Place, and Person)  Thought Content:  denies hallucinations, no delusions, less ruminative   Suicidal Thoughts:  No  Denies any suicidal ideations, denies any thoughts of hurting self   Homicidal Thoughts:  No  Memory:  recent and remote grossly intact   Judgement:  Other:  improved   Insight:  Present  Psychomotor Activity:  Normal  Concentration:  Good  Recall:  Good  Fund of Knowledge:Good  Language: Good  Akathisia:  Negative  Handed:  Right  AIMS (if indicated):     Assets:  Communication Skills Desire for Improvement Resilience  Sleep:  Number of Hours: 6.5  Cognition: WNL  ADL's:  Intact   Have you used any form of tobacco in the last 30 days? (Cigarettes, Smokeless Tobacco, Cigars, and/or Pipes): Yes  Has this patient used any form of tobacco in the last 30 days? (Cigarettes, Smokeless Tobacco, Cigars, and/or Pipes) Yes, A prescription for an FDA-approved tobacco cessation medication was offered at discharge and the patient refused  Mental Status Per Nursing Assessment::   On Admission:     Current Mental Status by Physician: At this time patient is improved  Compared to admission. She presents well groomed, good eye contact, speech normal, mood is euthymic,  affect is fully reactive , no SI or HI, no psychotic symptoms, future oriented. No lingering Opiate WDL symptoms. Describes high level of motivation in maintaining sobriety from illicit opiates   Loss Factors: Long history of opiate dependence.  Historical Factors: Substance Abuse history, history of depression  Risk Reduction Factors:   Responsible for children under 41 years of age, Sense of responsibility to family, Living with another person, especially a relative and Positive coping skills or problem solving skills  Continued Clinical Symptoms:  As above, patient improved compared to admission- currently no symptoms of WDL, mood euthymic, affect appropriate, full in range . No SI, no HI. Future oriented   Cognitive Features That Contribute To Risk:  No gross cognitive deficits noted upon discharge. Is alert , attentive, and oriented x 3   Suicide Risk:  Mild:  Suicidal ideation of limited frequency, intensity, duration, and specificity.  There are no identifiable plans, no associated intent, mild dysphoria and related symptoms, good self-control (both objective and subjective assessment), few other risk factors, and identifiable protective factors, including available and accessible social support.  Principal Problem: Major depressive disorder, recurrent, severe without psychotic features [facility]) Discharge Diagnoses:  Patient Active Problem List   Diagnosis Date Noted  . Major depressive disorder, recurrent, severe without psychotic features ([facility]) [F33.2]   . H/O borderline personality disorder [Z86.59] 09/25/2014  . Opioid use disorder, moderate, dependence ([facility]) [F11.20] 09/25/2014  . Cocaine use disorder, moderate, dependence ([facility]) [F14.20]     Follow-up Information    Follow  up with [facility] On 12/08/2014.   Why:  at 1:30pm for your intial intake assessment.   Contact information:   [address] [place] [address] [phone]      Follow up  with [facility].   Why:  CSW left a message for an appointment and will follow-up with you once appointment is obtained. You may also call this office to schedule your therapy appointment.    Contact information:   [address] [place] [id number] [phone]      Plan Of Care/Follow-up recommendations:  Activity:  as tolerated  Diet:  Regular Tests:  NA Other:  See below  Is patient on multiple antipsychotic therapies at discharge:  No   Has Patient had three or more failed trials of antipsychotic monotherapy by history:  No  Recommended Plan for Multiple Antipsychotic Therapies: NA  Patient is leaving unit in good spirits. Plans to go live with her parents Plans to go for outpatient Suboxone management to help with abstinence efforts Encouraged to go to [facility] regularly Patient encouraged to follow up with PCP/ GI specialist to follow up on Hep C positive status   COBOS, FERNANDO 12/08/2014, 1:05 PM
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
## 2014-12-08 NOTE — Progress Notes (Signed)
D: Pt is alert and oriented x4. Pt complained of severe anxiety with some mild depression. She states, "my anxiety is like an 8 and depression is at a 4; I stay anxious and depressed anyway but I think am a little more anxious than usual because I will be going home tomorrow." Pt could also be med seeking. She states, "Give me all the medicines I can have now." Pt was initially getting 400 mg of Neurontin and later increased to 600mg  starting 1700; Pt tried to get the extra 400 mg of Neurontin she didn't get in the morning and afternoon. Pt went through several crying spells because some of her medications were d/c. Pt also complained of moderate pain of 5 on a 0-10 pain scale. Pt denied SI/HI/AVH.  A: Medications offered as prescribed.  Support, encouragement, and safe environment provided.  15-minute safety checks continue.  R: Pt was med compliant.  Did not attend group. Safety checks continue.

## 2014-12-08 NOTE — Progress Notes (Signed)
  [facility] :  Will you be returning to the same living situation after discharge:  No, Pt discharging to father's house At discharge, do you have transportation home?: Yes,  parents to provide transportation Do you have the ability to pay for your medications: Yes,  Pt provided with prescriptions  Release of information consent forms completed and in the chart;  Patient's signature needed at discharge.  Patient to Follow up at: Follow-up Information    Follow up with [facility] On 12/08/2014.   Why:  at 1:30pm for your intial intake assessment.   Contact information:   [address] [place] [address] [phone]      Follow up with [facility].   Why:  CSW left a message for an appointment and will follow-up with you once appointment is obtained. You may also call this office to schedule your therapy appointment.    Contact information:   [address] [place] [id number] [phone]      Next level of care provider [facility] Link:no  Patient denies SI/HI: Yes,  Pt denies    Safety Planning and Suicide Prevention discussed: Yes,  with Pt; declines family contact.  Have you used any form of tobacco in the last 30 days? (Cigarettes, Smokeless Tobacco, Cigars, and/or Pipes): Yes  Has patient been referred to the Quitline?: Patient refused referral  Elaina HoopsCarter, Brenda Stafford 12/08/2014, 12:53 PM

## 2015-11-21 IMAGING — CT CT RENAL STONE PROTOCOL
2 of 3 series · 16 of 41 positions shown, 18 images · non-contrast
Comparison: None.

CLINICAL DATA: Right flank tenderness.  Cloudy urine, hematuria.

EXAM:
CT ABDOMEN AND PELVIS WITHOUT CONTRAST
TECHNIQUE: Multidetector CT imaging of the abdomen and pelvis was performed
following the standard protocol without IV contrast.

[Series 4: lung · axial · 0.64mm/px · z∈[-166,-46]mm · 13 of 27 slices shown, 15 images]
[im 2/27  soft-tissue]
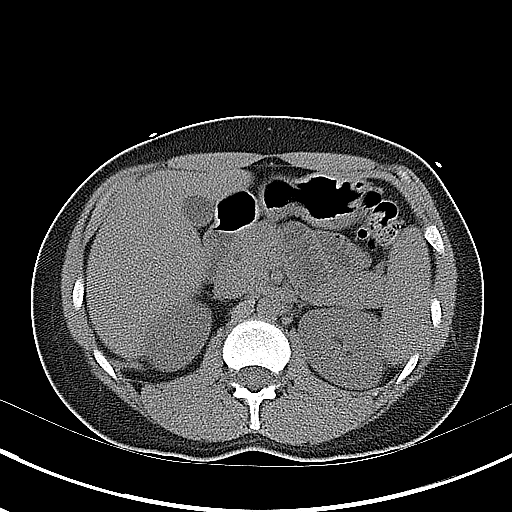
[im 2/27  bone]
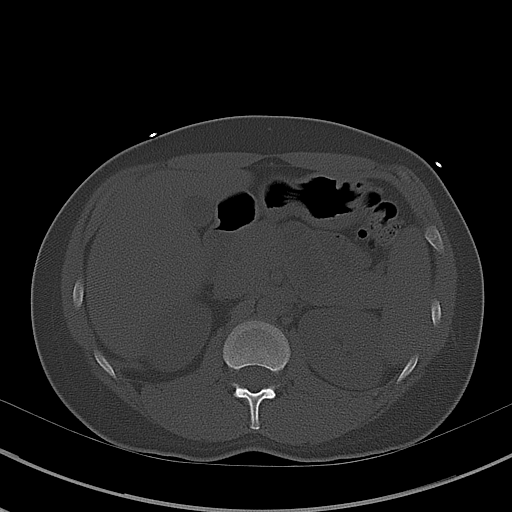
[im 4/27  soft-tissue]
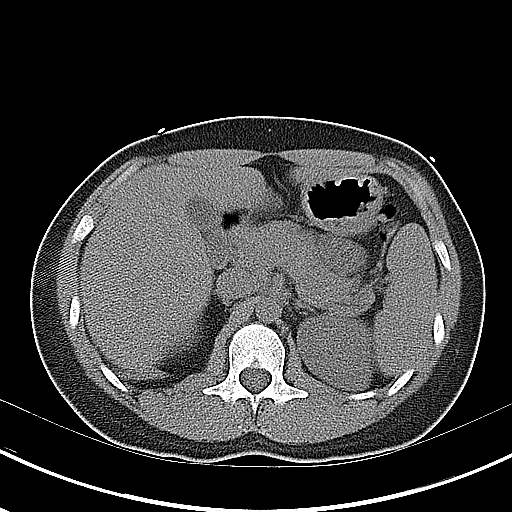
[im 6/27  soft-tissue]
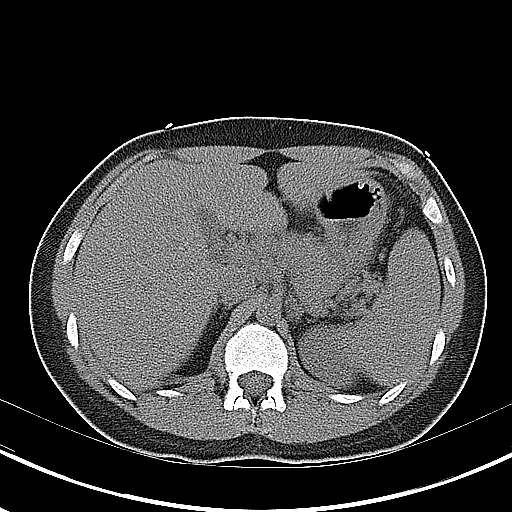
[im 8/27  soft-tissue]
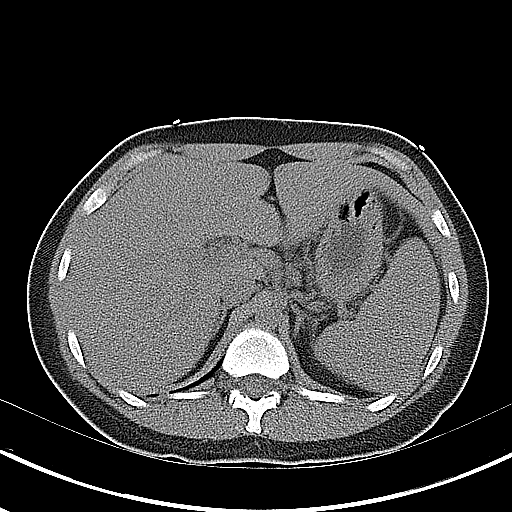
[im 10/27  soft-tissue]
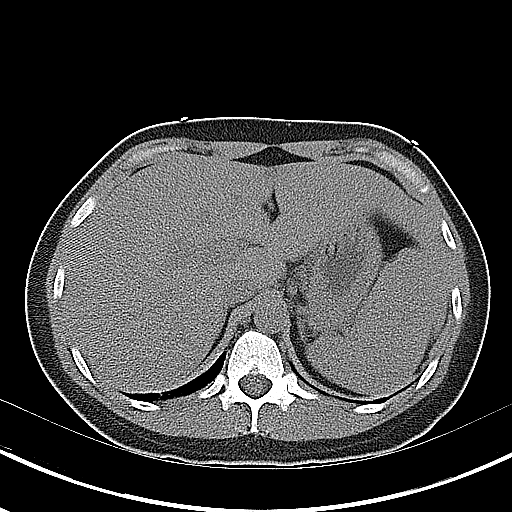
[im 12/27  soft-tissue]
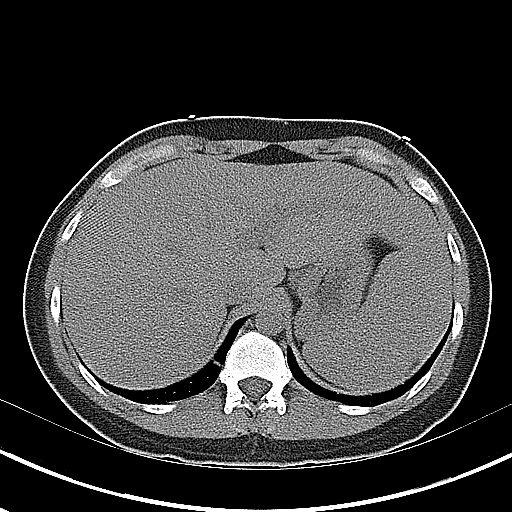
[im 14/27  soft-tissue]
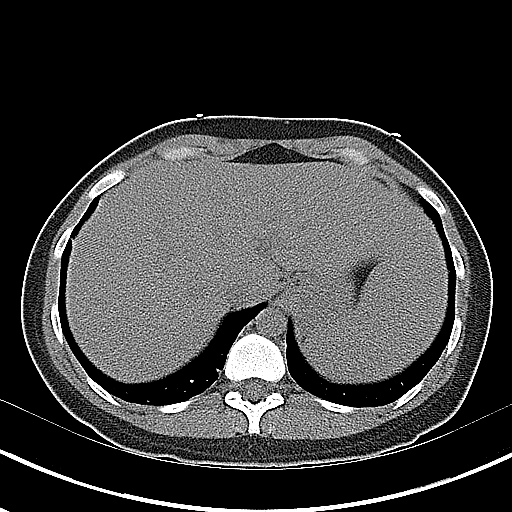
[im 16/27  soft-tissue]
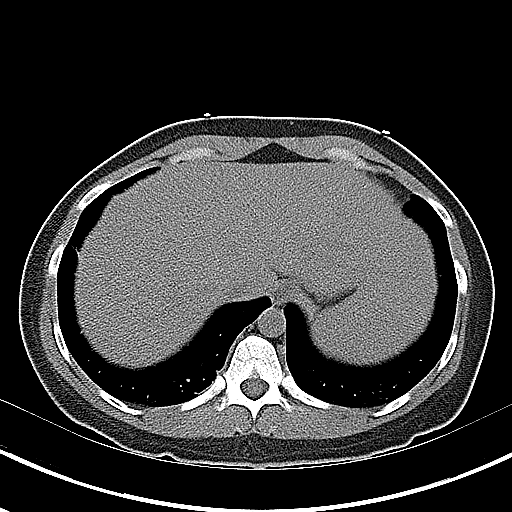
[im 18/27  soft-tissue]
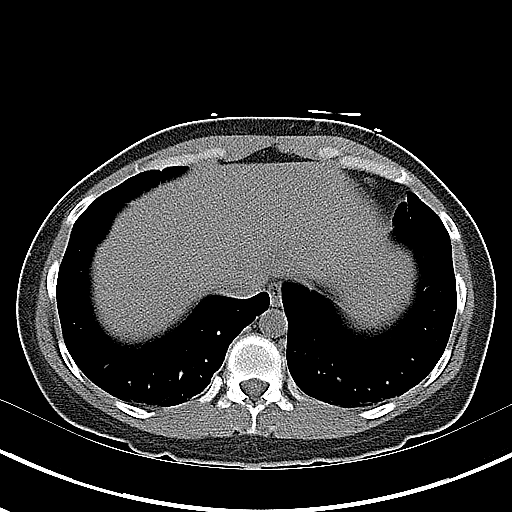
[im 18/27  bone]
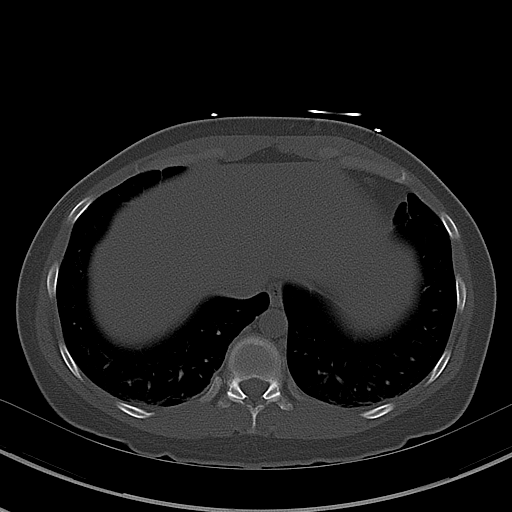
[im 20/27  soft-tissue]
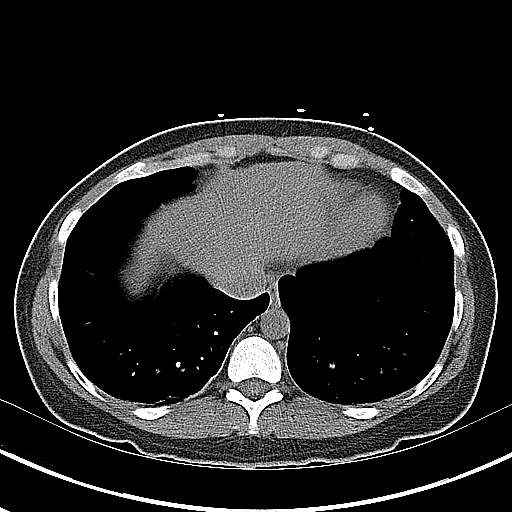
[im 22/27  soft-tissue]
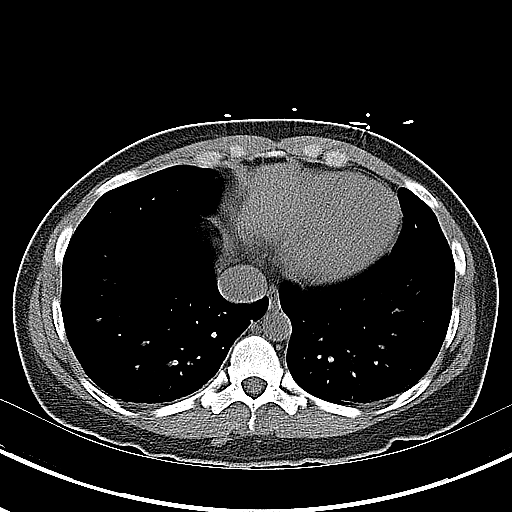
[im 24/27  soft-tissue]
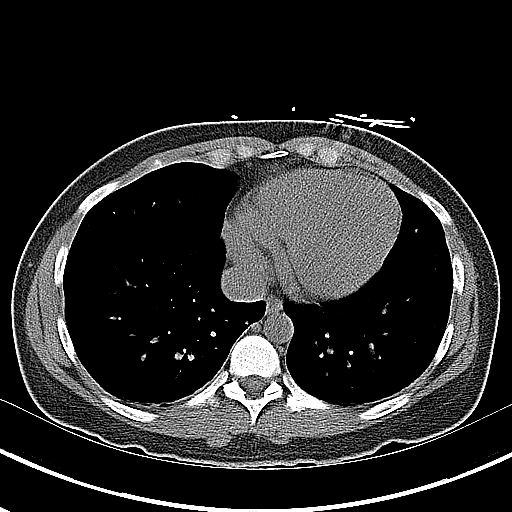
[im 26/27  soft-tissue]
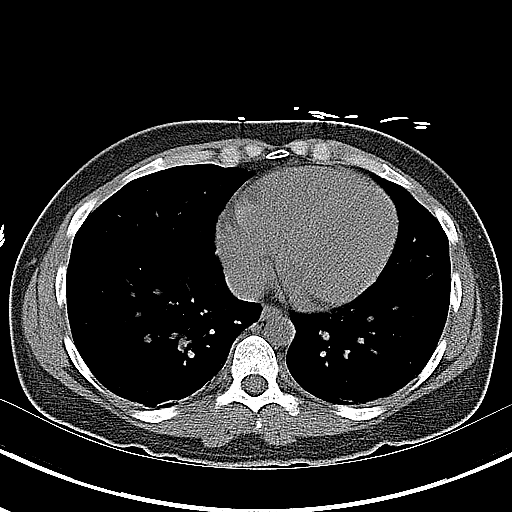

[Series 5: coronal · coronal · 0.62mm/px · 3 of 74 slices shown]
[im 25/74  soft-tissue]
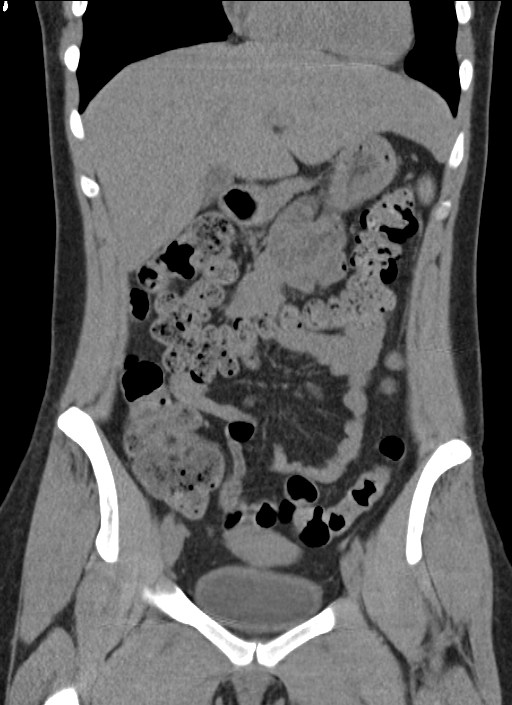
[im 33/74  soft-tissue]
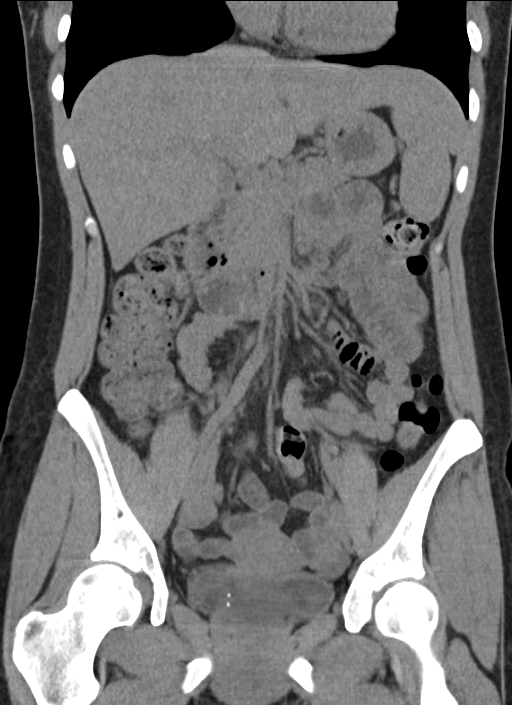
[im 41/74  soft-tissue]
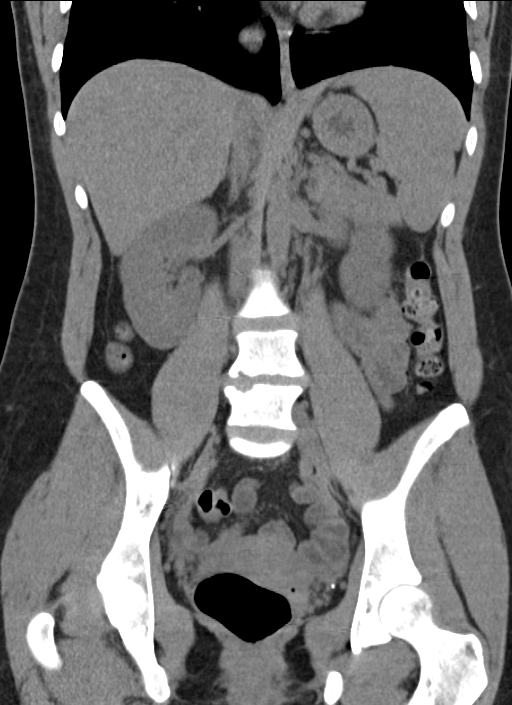

[16 of 41 positions shown; findings below may reference images not displayed]

FINDINGS: Lung bases are clear.  No effusions.  Heart is normal size.

Liver, gallbladder, spleen, pancreas, adrenals are unremarkable.
Punctate nonobstructing stones in the upper and lower poles of the
right kidney. No renal stones on the left. No ureteral stones or
hydronephrosis. Multiple calcifications in the pelvis compatible
with phleboliths. Uterus, adnexae and urinary bladder grossly
unremarkable.

Stomach, large and small bowel are unremarkable. Appendix is
visualized and is normal. No free fluid, free air or adenopathy. No
acute bony abnormality or focal bone lesion.
IMPRESSION: Punctate right nephrolithiasis. No ureteral stones or
hydronephrosis.

## 2015-11-21 IMAGING — CT CT NECK W/ CM
4 series · 16 of 33 positions shown, 19 images · IV contrast (OMNIPAQUE 300)
Comparison: None.

CLINICAL DATA: Neck and throat swelling.  History of IV drug use.

EXAM:
CT NECK WITH CONTRAST
TECHNIQUE: Multidetector CT imaging of the neck was performed using the
standard protocol following the bolus administration of intravenous
contrast.
CONTRAST:  75mL OMNIPAQUE IOHEXOL 300 MG/ML  SOLN

[Series 2: neck with st · axial · 0.47mm/px · z∈[-234,-162]mm · 3 of 109 slices shown]
[im 19/109  bone]
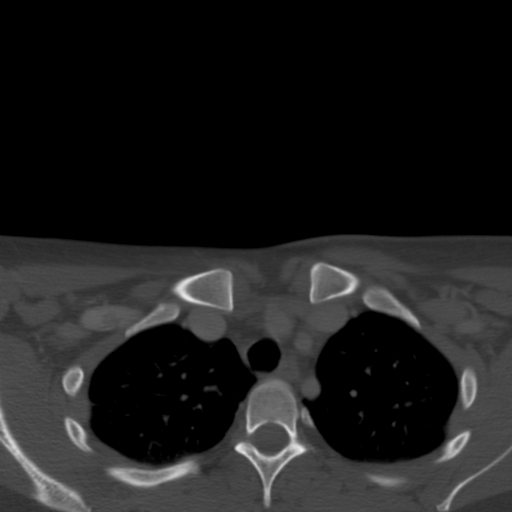
[im 37/109  bone]
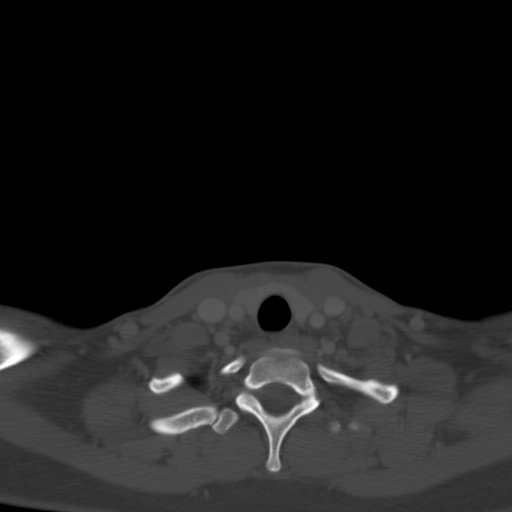
[im 55/109  bone]
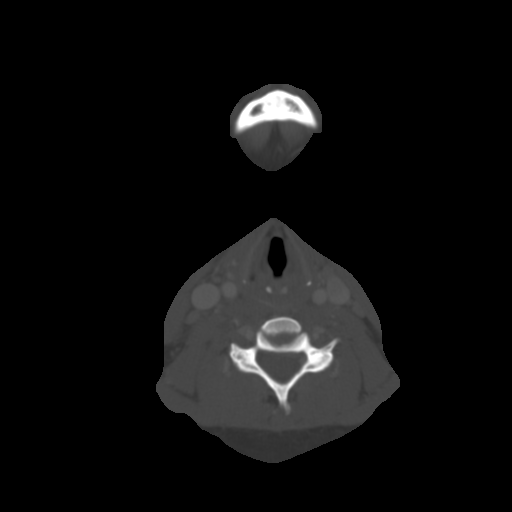

[Series 602: <mpr thick range> · coronal · 0.47mm/px · 3 of 85 slices shown]
[im 25/85  bone]
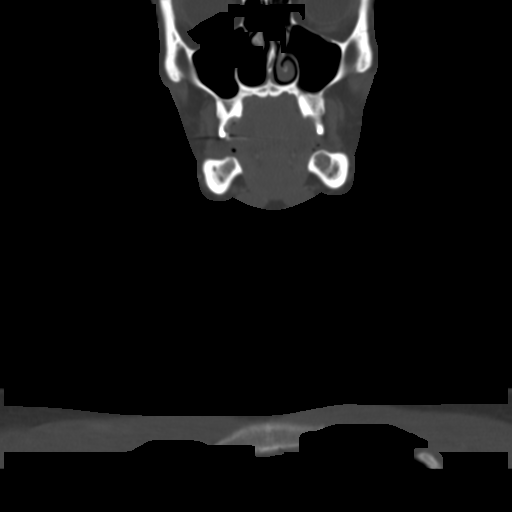
[im 37/85  bone]
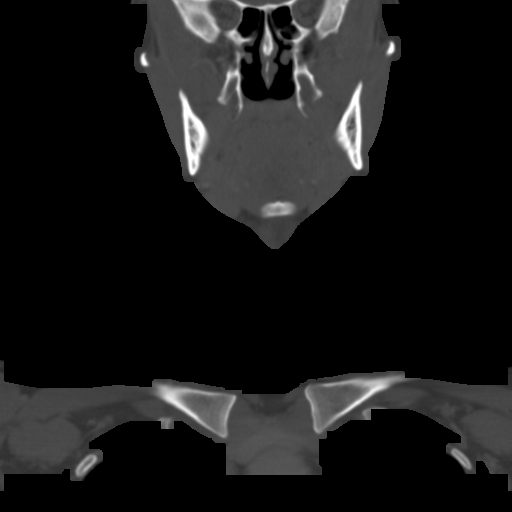
[im 49/85  bone]
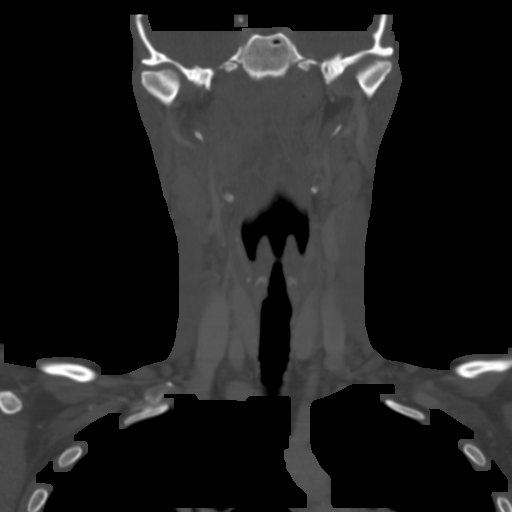

[Series 603: <mpr thick range(1)> · axial · 0.47mm/px · z∈[-260,-103]mm · 5 of 122 slices shown, 7 images]
[im 21/122  soft-tissue]
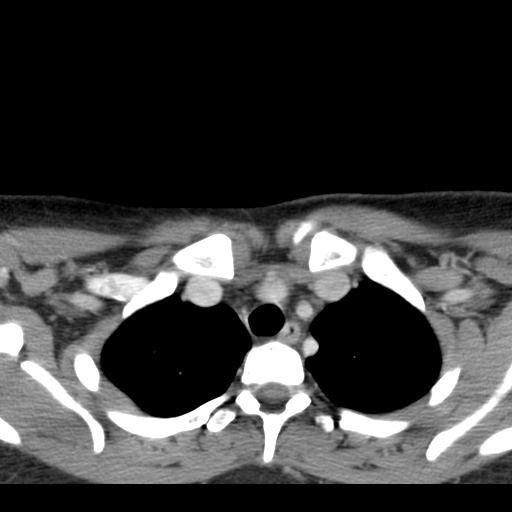
[im 21/122  bone]
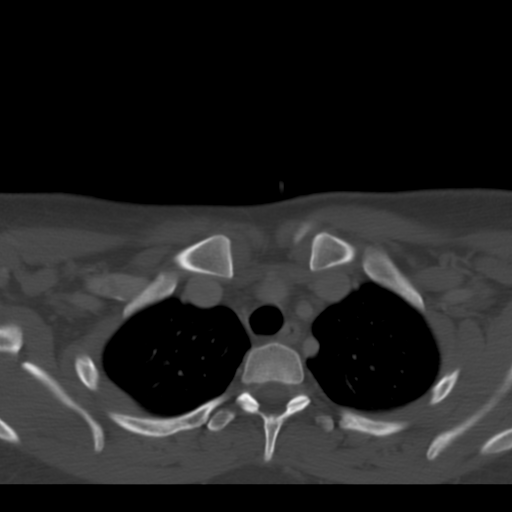
[im 41/122  bone]
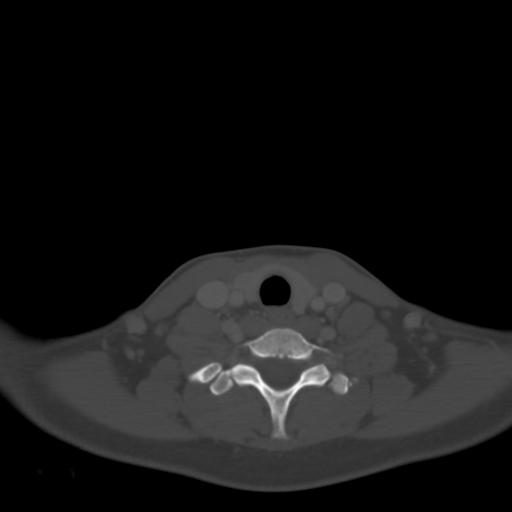
[im 61/122  bone]
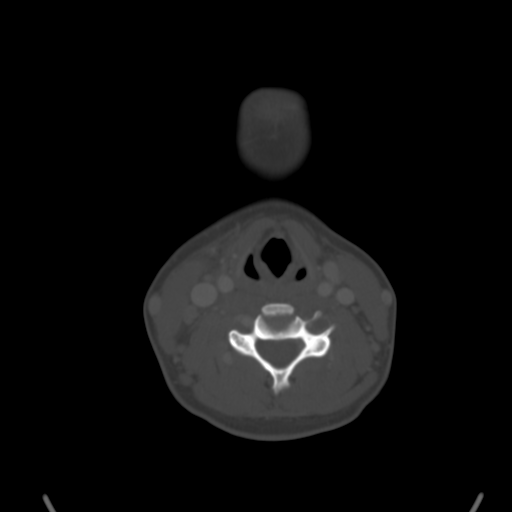
[im 81/122  bone]
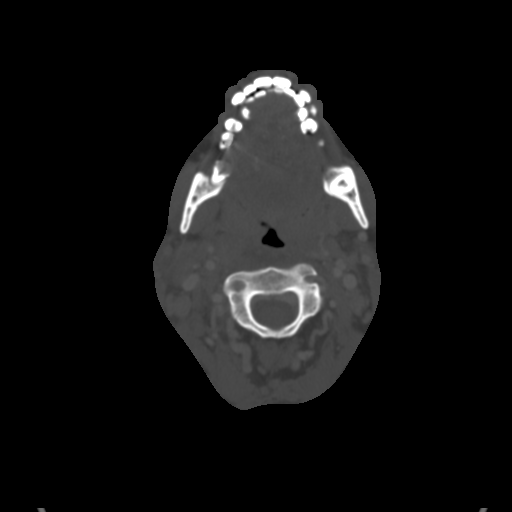
[im 101/122  soft-tissue]
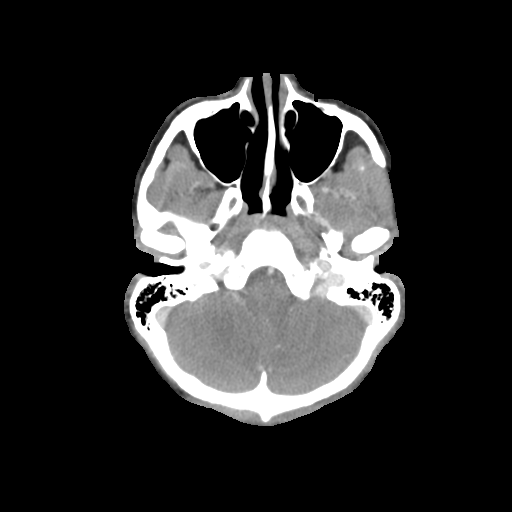
[im 101/122  bone]
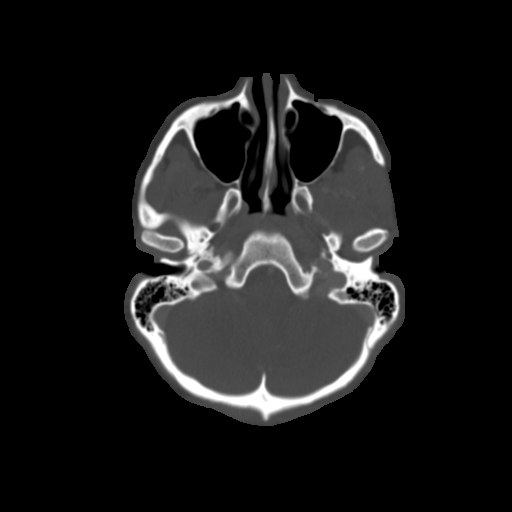

[Series 604: <mpr thick range(2)> · sagittal · 0.47mm/px · 5 of 80 slices shown, 6 images]
[im 27/80  bone]
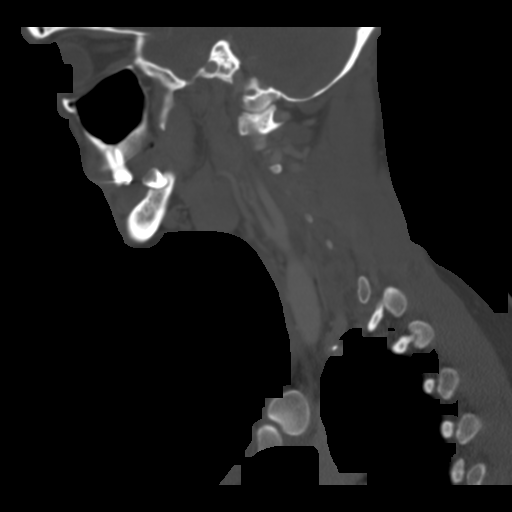
[im 33/80  bone]
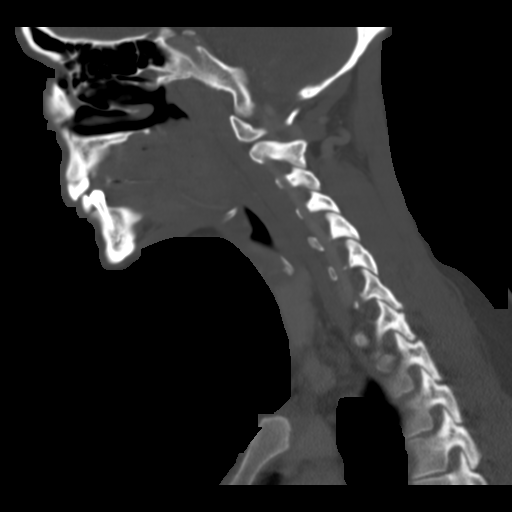
[im 40/80  soft-tissue]
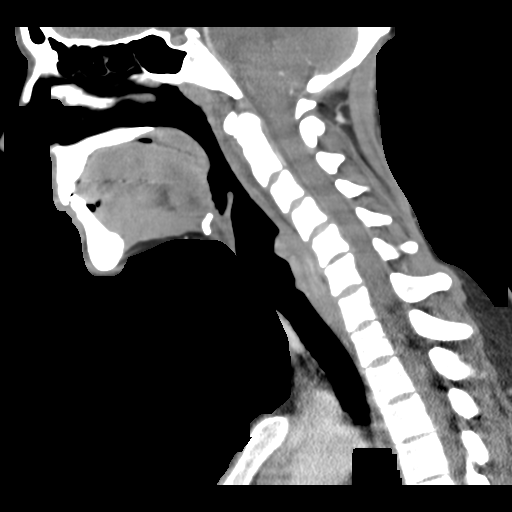
[im 40/80  bone]
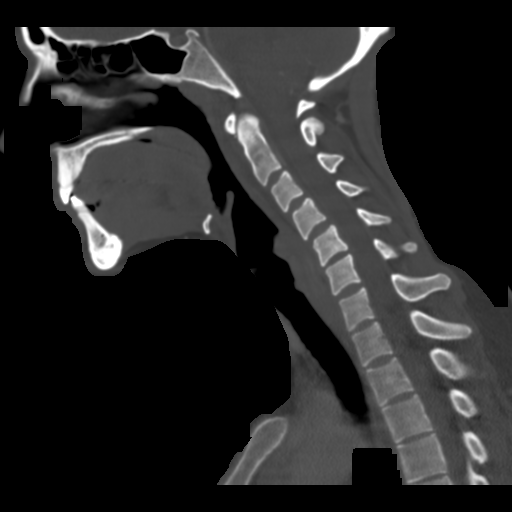
[im 47/80  bone]
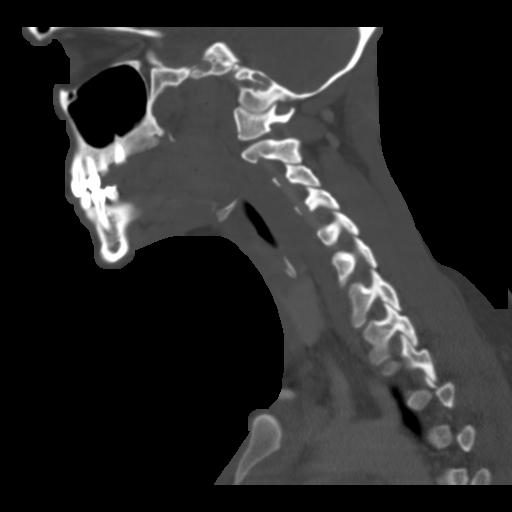
[im 53/80  bone]
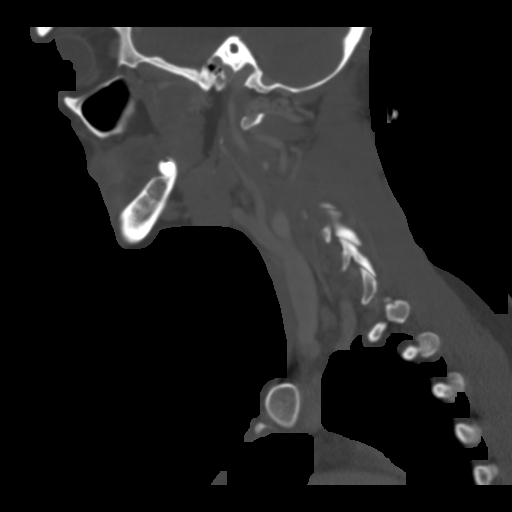

[16 of 33 positions shown; findings below may reference images not displayed]

FINDINGS: Pharynx and larynx: No tonsillar or peritonsillar abscess.
Retropharyngeal effusion without definable abscess.

Normal larynx.  No airway compromise.

Salivary glands: Unremarkable

Thyroid: Unremarkable

Lymph nodes: BILATERAL level II adenopathy. Short axis measurement
on the RIGHT 16 mm as seen on image 39. 11 mm on the LEFT at a
similar location. Smaller level III, IV, and V nodes. BILATERAL
retropharyngeal nodes, greater on the RIGHT.

Vascular: Negative.

Limited intracranial: Negative.

Visualized orbits: Negative.

Mastoids and visualized paranasal sinuses: Negative.

Skeleton: Spondylosis with disc space narrowing at C5-6. No
worrisome osseous lesion. Poor dentition with periapical lucencies
and dental caries. Multiple teeth missing.

Upper chest: No lung apex lesion.
IMPRESSION: Retropharyngeal effusion without definable abscess. No tonsillar or
peritonsillar inflammation or abscess. Significant BILATERAL level
II adenopathy, RIGHT greater than LEFT likely reactive. Etiology
unclear, but suspect some type of inflammatory process related to IV
drug use, either systemic or local. No pulmonary nodules or
abscesses to suggest septic emboli.
